# Patient Record
Sex: Female | Born: 1940 | Race: White | Hispanic: No | Smoking: Never smoker
Health system: Southern US, Community
[De-identification: ages and names within clinical notes are randomized; demographics above are authoritative.]

## PROBLEM LIST (undated history)

## (undated) DIAGNOSIS — E7800 Pure hypercholesterolemia, unspecified: Secondary | ICD-10-CM

## (undated) DIAGNOSIS — C801 Malignant (primary) neoplasm, unspecified: Secondary | ICD-10-CM

## (undated) HISTORY — PX: BREAST SURGERY: SHX581

---

## 2018-08-21 ENCOUNTER — Observation Stay (HOSPITAL_BASED_OUTPATIENT_CLINIC_OR_DEPARTMENT_OTHER)
Admission: EM | Admit: 2018-08-21 | Discharge: 2018-08-22 | Disposition: A | Discharge status: Home / Self Care | Payer: Medicare Other | Attending: Internal Medicine | Admitting: Internal Medicine

## 2018-08-21 ENCOUNTER — Encounter (HOSPITAL_BASED_OUTPATIENT_CLINIC_OR_DEPARTMENT_OTHER): Payer: Self-pay | Admitting: *Deleted

## 2018-08-21 ENCOUNTER — Other Ambulatory Visit: Payer: Self-pay

## 2018-08-21 ENCOUNTER — Emergency Department (HOSPITAL_BASED_OUTPATIENT_CLINIC_OR_DEPARTMENT_OTHER): Payer: Medicare Other

## 2018-08-21 DIAGNOSIS — E272 Addisonian crisis: Secondary | ICD-10-CM | POA: Diagnosis not present

## 2018-08-21 DIAGNOSIS — Z853 Personal history of malignant neoplasm of breast: Secondary | ICD-10-CM | POA: Insufficient documentation

## 2018-08-21 DIAGNOSIS — I129 Hypertensive chronic kidney disease with stage 1 through stage 4 chronic kidney disease, or unspecified chronic kidney disease: Secondary | ICD-10-CM | POA: Diagnosis not present

## 2018-08-21 DIAGNOSIS — E7800 Pure hypercholesterolemia, unspecified: Secondary | ICD-10-CM | POA: Insufficient documentation

## 2018-08-21 DIAGNOSIS — Z7982 Long term (current) use of aspirin: Secondary | ICD-10-CM | POA: Insufficient documentation

## 2018-08-21 DIAGNOSIS — Z9889 Other specified postprocedural states: Secondary | ICD-10-CM | POA: Diagnosis not present

## 2018-08-21 DIAGNOSIS — Z79899 Other long term (current) drug therapy: Secondary | ICD-10-CM | POA: Insufficient documentation

## 2018-08-21 DIAGNOSIS — J449 Chronic obstructive pulmonary disease, unspecified: Secondary | ICD-10-CM | POA: Diagnosis present

## 2018-08-21 DIAGNOSIS — N183 Chronic kidney disease, stage 3 (moderate): Secondary | ICD-10-CM | POA: Insufficient documentation

## 2018-08-21 DIAGNOSIS — E785 Hyperlipidemia, unspecified: Secondary | ICD-10-CM | POA: Diagnosis not present

## 2018-08-21 DIAGNOSIS — Z885 Allergy status to narcotic agent status: Secondary | ICD-10-CM | POA: Insufficient documentation

## 2018-08-21 DIAGNOSIS — I9589 Other hypotension: Secondary | ICD-10-CM | POA: Diagnosis not present

## 2018-08-21 DIAGNOSIS — Z859 Personal history of malignant neoplasm, unspecified: Secondary | ICD-10-CM | POA: Insufficient documentation

## 2018-08-21 DIAGNOSIS — R531 Weakness: Secondary | ICD-10-CM | POA: Diagnosis present

## 2018-08-21 DIAGNOSIS — D649 Anemia, unspecified: Secondary | ICD-10-CM | POA: Insufficient documentation

## 2018-08-21 DIAGNOSIS — J441 Chronic obstructive pulmonary disease with (acute) exacerbation: Secondary | ICD-10-CM | POA: Insufficient documentation

## 2018-08-21 HISTORY — DX: Malignant (primary) neoplasm, unspecified: C80.1

## 2018-08-21 HISTORY — DX: Pure hypercholesterolemia, unspecified: E78.00

## 2018-08-21 LAB — COMPREHENSIVE METABOLIC PANEL
ALBUMIN: 3 g/dL — AB (ref 3.5–5.0)
ALT: 17 U/L (ref 0–44)
AST: 15 U/L (ref 15–41)
Alkaline Phosphatase: 48 U/L (ref 38–126)
Anion gap: 10 (ref 5–15)
BUN: 34 mg/dL — AB (ref 8–23)
CHLORIDE: 100 mmol/L (ref 98–111)
CO2: 27 mmol/L (ref 22–32)
CREATININE: 1.52 mg/dL — AB (ref 0.44–1.00)
Calcium: 8.8 mg/dL — ABNORMAL LOW (ref 8.9–10.3)
GFR calc Af Amer: 38 mL/min — ABNORMAL LOW (ref >60)
GFR, EST NON AFRICAN AMERICAN: 32 mL/min — AB (ref >60)
GLUCOSE: 102 mg/dL — AB (ref 70–99)
POTASSIUM: 4.4 mmol/L (ref 3.5–5.1)
Sodium: 137 mmol/L (ref 135–145)
Total Bilirubin: 0.8 mg/dL (ref 0.3–1.2)
Total Protein: 5.7 g/dL — ABNORMAL LOW (ref 6.5–8.1)

## 2018-08-21 LAB — CBC WITH DIFFERENTIAL/PLATELET
Abs Immature Granulocytes: 0.15 10*3/uL — ABNORMAL HIGH (ref 0.00–0.07)
BASOS PCT: 0 %
Basophils Absolute: 0 10*3/uL (ref 0.0–0.1)
Eosinophils Absolute: 0.1 10*3/uL (ref 0.0–0.5)
Eosinophils Relative: 1 %
HCT: 36.2 % (ref 36.0–46.0)
Hemoglobin: 11.2 g/dL — ABNORMAL LOW (ref 12.0–15.0)
IMMATURE GRANULOCYTES: 2 %
LYMPHS ABS: 2.1 10*3/uL (ref 0.7–4.0)
Lymphocytes Relative: 23 %
MCH: 30.3 pg (ref 26.0–34.0)
MCHC: 30.9 g/dL (ref 30.0–36.0)
MCV: 97.8 fL (ref 80.0–100.0)
MONOS PCT: 8 %
Monocytes Absolute: 0.8 10*3/uL (ref 0.1–1.0)
NEUTROS ABS: 6 10*3/uL (ref 1.7–7.7)
NEUTROS PCT: 66 %
PLATELETS: 165 10*3/uL (ref 150–400)
RBC: 3.7 MIL/uL — ABNORMAL LOW (ref 3.87–5.11)
RDW: 14.8 % (ref 11.5–15.5)
WBC: 9 10*3/uL (ref 4.0–10.5)
nRBC: 0 % (ref 0.0–0.2)

## 2018-08-21 LAB — CBG MONITORING, ED: Glucose-Capillary: 84 mg/dL (ref 70–99)

## 2018-08-21 LAB — URINALYSIS, ROUTINE W REFLEX MICROSCOPIC
BILIRUBIN URINE: NEGATIVE
GLUCOSE, UA: NEGATIVE mg/dL
Hgb urine dipstick: NEGATIVE
Ketones, ur: NEGATIVE mg/dL
Leukocytes, UA: NEGATIVE
Nitrite: NEGATIVE
PH: 5.5 (ref 5.0–8.0)
Protein, ur: NEGATIVE mg/dL

## 2018-08-21 LAB — I-STAT CG4 LACTIC ACID, ED: LACTIC ACID, VENOUS: 0.53 mmol/L (ref 0.5–1.9)

## 2018-08-21 MED ORDER — SODIUM CHLORIDE 0.9 % IV BOLUS: 1000.0000 mL | Freq: Once | INTRAVENOUS | Status: DC

## 2018-08-21 MED ORDER — HYDROCORTISONE NA SUCCINATE PF 100 MG IJ SOLR
100.0000 mg | Freq: Once | INTRAMUSCULAR | Status: AC
  Administered 2018-08-21: 100 mg via INTRAVENOUS
  Filled 2018-08-21: qty 2

## 2018-08-21 MED ORDER — LACTATED RINGERS IV BOLUS
1000.0000 mL | Freq: Once | INTRAVENOUS | Status: AC
  Administered 2018-08-21: 1000 mL via INTRAVENOUS

## 2018-08-21 NOTE — ED Triage Notes (Signed)
Sent from her MD's office with BP 70/ MD states she needs IV fluids. Pt states her BP is always low.

## 2018-08-21 NOTE — ED Provider Notes (Signed)
[facility] EMERGENCY DEPARTMENT Provider Note   CSN: [id number] Arrival date & time: 08/21/18  1453     History   Chief Complaint Chief Complaint  Patient presents with  . Weakness    HPI Alane Hanssen is a 77 y.o. female with past medical history of COPD, adrenal insufficiency, peripheral vascular disease, breast cancerin left breast, who presents today for evaluation of hypertension.  She went to her endocrinologist today where they were having a difficult time finding her blood pressure.  She was referred here.  She tells me that she has been on 30 mg of prednisone for the past 30 days however ran out of this on Wednesday.  She was started on this during a hospital admission from 9 22-10 2 at [facility] for adrenal insufficiency.  She reports that since Wednesday when she ran out she has generally not been feeling well.  She reports puffy face, feeling weak, and generally not feeling well.  She reports that on Wednesday she walked into a door frame causing her to fall.  She does not take blood thinners other than aspirin.  She reports that she hit her face on the door frame, and has had bruising around her right eye since.  She reports pain in her right sided chest and abdomen from the fall.  She has not been using her inhalers for her COPD recently.  HPI  Past Medical History:  Diagnosis Date  . Cancer ([place])   . High cholesterol     Patient Active Problem List   Diagnosis Date Noted  . Adrenal crisis ([place]) 08/21/2018    Past Surgical History:  Procedure Laterality Date  . BREAST SURGERY       OB History   None      Home Medications    Prior to Admission medications   Medication Sig Start Date End Date Taking? Authorizing Provider  aspirin 81 MG tablet Take 81 mg by mouth daily.   Yes [provider]  citalopram (CELEXA) 10 MG tablet Take 10 mg by mouth daily.   Yes [provider]  ezetimibe (ZETIA) 10 MG tablet Take 10  mg by mouth daily.   Yes [provider]  SIMVASTATIN PO Take by mouth.   Yes [provider]  Zolpidem Tartrate (AMBIEN PO) Take by mouth.   Yes [provider]    Family History No family history on file.  Social History Social History   Tobacco Use  . Smoking status: Never Smoker  . Smokeless tobacco: Never Used  Substance Use Topics  . Alcohol use: Never    Frequency: Never  . Drug use: Never     Allergies   Codeine   Review of Systems Review of Systems  Constitutional: Positive for activity change, appetite change and fatigue.  HENT: Positive for facial swelling (Generalized puffy face).   Eyes: Negative for visual disturbance.  Respiratory: Negative for chest tightness and shortness of breath.   Cardiovascular: Positive for chest pain (Right sided since fall).  Gastrointestinal: Positive for abdominal pain (Right sided since fall). Negative for diarrhea, nausea and vomiting.  Musculoskeletal: Negative for neck pain.  Neurological: Positive for light-headedness.  All other systems reviewed and are negative.    Physical Exam Updated Vital Signs BP 104/63   Pulse 87   Temp 98.1 F (36.7 C) (Oral)   Resp 18   Ht 5\' 6"  (1.676 m)   Wt 92.6 kg   SpO2 93%  BMI 32.94 kg/m   Physical Exam  Constitutional: She is oriented to person, place, and time. She appears well-developed and well-nourished. No distress.  HENT:  Head: Normocephalic.  Mouth/Throat: Oropharynx is clear and moist.  Face is diffusely puffy.  Ecchymosis under right eye.   Eyes: Conjunctivae are normal.  Neck: Normal range of motion. Neck supple.  No midline TTP.   Cardiovascular: Normal rate, regular rhythm and normal heart sounds.  No murmur heard. Pulses:      Radial pulses are 1+ on the right side, and 1+ on the left side.  Initial evaluation unable to palpate radial pulse, after fluids able to palpate both radial pulses.   Pulmonary/Chest: Effort normal. No  respiratory distress.  Right upper field rhonchi  Abdominal: Soft. There is no tenderness.  Musculoskeletal: She exhibits no edema.  Diffuse TTP over bilateral upper and lower extremities with out localized deformity, crepitus or one area that is more painful.   Neurological: She is alert and oriented to person, place, and time.  Skin: Skin is warm and dry. She is not diaphoretic.  Psychiatric: She has a normal mood and affect. Her behavior is normal.  Nursing note and vitals reviewed.    ED Treatments / Results  Labs (all labs ordered are listed, but only abnormal results are displayed) Labs Reviewed  CBC WITH DIFFERENTIAL/PLATELET - Abnormal; Notable for the following components:      Result Value   RBC 3.70 (*)    Hemoglobin 11.2 (*)    Abs Immature Granulocytes 0.15 (*)    All other components within normal limits  COMPREHENSIVE METABOLIC PANEL - Abnormal; Notable for the following components:   Glucose, Bld 102 (*)    BUN 34 (*)    Creatinine, Ser 1.52 (*)    Calcium 8.8 (*)    Total Protein 5.7 (*)    Albumin 3.0 (*)    GFR calc non Af Amer 32 (*)    GFR calc Af Amer 38 (*)    All other components within normal limits  URINALYSIS, ROUTINE W REFLEX MICROSCOPIC - Abnormal; Notable for the following components:   Specific Gravity, Urine >1.030 (*)    All other components within normal limits  CBG MONITORING, ED  I-STAT CG4 LACTIC ACID, ED  I-STAT CG4 LACTIC ACID, ED    EKG None  Radiology Dg Chest Portable 1 View  Result Date: 08/21/2018 CLINICAL DATA:  Low blood pressure. EXAM: PORTABLE CHEST 1 VIEW COMPARISON:  None. FINDINGS: The cardiomediastinal silhouette is within normal limits. There is eventration of the right hemidiaphragm. No airspace consolidation, edema, pleural effusion, pneumothorax is identified. No acute osseous abnormality is seen. Left axillary surgical clips are noted. IMPRESSION: No active disease. Electronically Signed   By: Logan Bores M.D.    On: 08/21/2018 15:59    Procedures Procedures (including critical care time) CRITICAL CARE Performed by: Wyn Quaker Total critical care time: 75 minutes Critical care time was exclusive of separately billable procedures and treating other patients. Critical care was necessary to treat or prevent imminent or life-threatening deterioration. Critical care was time spent personally by me on the following activities: development of treatment plan with patient and/or surrogate as well as nursing, discussions with consultants, evaluation of patient's response to treatment, examination of patient, obtaining history from patient or surrogate, ordering and performing treatments and interventions, ordering and review of laboratory studies, ordering and review of radiographic studies, pulse oximetry and re-evaluation of patient's condition.  Hypotension, addisonian crisis requiring admission,  extensive time spent on finding appropriate placement for the patient that she was willing to go to.    Medications Ordered in ED Medications  hydrocortisone sodium succinate (SOLU-CORTEF) 100 MG injection 100 mg (100 mg Intravenous Given 08/21/18 1555)  lactated ringers bolus 1,000 mL (0 mLs Intravenous Stopped 08/21/18 1825)     Initial Impression / Assessment and Plan / ED Course  I have reviewed the triage vital signs and the nursing notes.  Pertinent labs & imaging results that were available during my care of the patient were reviewed by me and considered in my medical decision making (see chart for details).  Clinical Course as of Aug 22 2343  Mon Aug 21, 2018  1530 Patient BP 62 systolic.  Ordered Iv, Fluids, steroids.    [EH]  4010 [place] [facility] does not have beds   [EH]  1755 Patient wishes to go to [facility] medical center.    [EH]  [place] Dr. Alease Medina from [facility] said [facility] is not appropriate given that they do not have endocrinology and  patient needs to be seen by endocrinology.  [facility] physician to physician line is coordinating speaking with hospitalist at [facility].   [EH]  1846 Dr. Bynum Bellows at [facility] says that they do not have in-house endocrinology.  Recommends seeing if [facility] does and if they will take the patient, if not she will admit patient.    [EH]  [facility] states that it would be a 24 to 48-hour wait before patient can be accepted onto a floor bed at the hospital.  Will call back [place].   [EH]  1904 Spoke with Elmyra Ricks at [facility] Dr. connect physician line, phone [phone], who will facilitate getting back in touch with hospitalist.   [EH]  1912 Spoke with Elmyra Ricks at [facility] Dr. Ronnie [facility] physician line.  She says Dr. Gaynelle Adu will accept patient pending bed availability.    [EH]  1944 Spoke with Elmyra Ricks at [facility] Dr. connect physician line, she has placed a bed request, waiting to hear back from bed placement.  Will notify me either way.   [EH]  2036 Secretary spoke with Elmyra Ricks at [facility].  [facility] does not have beds available.  Discussed this with patient, will attempt to place patient at [facility]   [EH]  2049 Spoke with Dr. Hal Hope from [facility] hospitalist who accepts patient in transfer at [facility].   [EH]  2216 Updated patient on disposition and plan.   [EH]    Clinical Course User Index [EH] Lorin Glass, PA-C   Patient presents today from her endocrinologist office for evaluation of hypotension.  She had stopped her prednisone last Wednesday and since then has not been feeling well.  She had been on prednisone for 30 days 30 mg.  She was hypotensive here, pressure of 62 systolic.  She was treated with 1 L of IV fluids, and given 100 mg of Solu-Cortef, to treat presumed adrenal crisis.  This stabilized her condition, she still had soft pressures however they were systolics over 347.  Her creatinine is elevated at 1.52 with a GFR of 23.  Lactic acid was  not elevated.  Portable chest x-ray was obtained without evidence of acute abnormality.  EKG was obtained and reviewed.    Extensive time was spent coordinating patient admission and transfer of care due to patient preference, and multiple facilities without available floor beds.  Please see ED clinical course.  After was  unable to have patient admitted to the floor at 2 [facility] facilities, [facility], and [facility] patient agreed for transfer to [facility].  I spoke with Dr. Hal Hope who accepts the patient in transfer from [facility].  Patient was monitored while in the emergency room.    [facility] was [facility].   Final Clinical Impressions(s) / ED Diagnoses   Final diagnoses:  Acute adrenal crisis [facility])  Other specified hypotension    ED Discharge Orders    None       Lorin Glass, PA-C 08/21/18 2351    Lennice Sites, DO 08/22/18 0123    Lennice Sites, DO 08/22/18 0124

## 2018-08-21 NOTE — ED Provider Notes (Addendum)
Medical screening examination/treatment/procedure(s) were conducted as a shared visit with non-physician practitioner(s) and myself.  I personally evaluated the patient during the encounter. Briefly, the patient is a 77 y.o. female with history of high cholesterol, recent chronic steroid use for the last 2 months for adrenal insufficiency who presents to the ED with low blood pressure.  Patient with blood pressure 62/32 upon arrival however patient mentating well.  No chest pain, no infectious symptoms.  No fever, no tachycardia.  Patient was sent over from endocrinologist for likely steroids.  Patient was on a 34-month treatment of steroids for an adrenal issue.  She was not put on a steroid taper.  She finished her 30 mg of prednisone daily about 3 or 4 days ago and had follow-up with endocrinologist today and was found to be hypotensive and brought here.  Endocrinologist note states that patient likely needs stress dose steroids and recommends the patient start steroids again at 20 mg a day and will likely create taper for the patient after a month.  Patient without any infectious symptoms, no dysuria, no cough, no sputum production.  No fever.  Neurologically intact.  Overall is well-appearing.  Patient was given Solu-Cortef IV as well as IV fluid bolus.  Lab work was obtained that showed no significant anemia, electrolyte abnormality.  Creatinine mildly elevated.  Patient with no significant leukocytosis.  No signs of pneumonia on chest x-ray.  No signs of urinary tract infection.  Patient with improvement of blood pressure following Solu-Cortef and fluids however will admit the patient for further care and observation.  Suspect patient will improve with steroids and will follow up with endocrinology for appropriate steroid taper.  This chart was dictated using voice recognition software.  Despite best efforts to proofread,  errors can occur which can change the documentation meaning.    EKG  Interpretation None           Lennice Sites, DO 08/21/18 [place], [place], DO 08/21/18 1751

## 2018-08-22 ENCOUNTER — Encounter (HOSPITAL_COMMUNITY): Payer: Self-pay | Admitting: Internal Medicine

## 2018-08-22 DIAGNOSIS — E785 Hyperlipidemia, unspecified: Secondary | ICD-10-CM | POA: Diagnosis present

## 2018-08-22 DIAGNOSIS — J449 Chronic obstructive pulmonary disease, unspecified: Secondary | ICD-10-CM | POA: Diagnosis present

## 2018-08-22 DIAGNOSIS — E272 Addisonian crisis: Secondary | ICD-10-CM

## 2018-08-22 DIAGNOSIS — Z853 Personal history of malignant neoplasm of breast: Secondary | ICD-10-CM

## 2018-08-22 DIAGNOSIS — I9589 Other hypotension: Secondary | ICD-10-CM | POA: Diagnosis not present

## 2018-08-22 LAB — BASIC METABOLIC PANEL
ANION GAP: 8 (ref 5–15)
BUN: 28 mg/dL — ABNORMAL HIGH (ref 8–23)
CALCIUM: 8.4 mg/dL — AB (ref 8.9–10.3)
CO2: 25 mmol/L (ref 22–32)
Chloride: 105 mmol/L (ref 98–111)
Creatinine, Ser: 1.65 mg/dL — ABNORMAL HIGH (ref 0.44–1.00)
GFR calc Af Amer: 34 mL/min — ABNORMAL LOW (ref >60)
GFR calc non Af Amer: 29 mL/min — ABNORMAL LOW (ref >60)
Glucose, Bld: 197 mg/dL — ABNORMAL HIGH (ref 70–99)
Potassium: 4.1 mmol/L (ref 3.5–5.1)
Sodium: 138 mmol/L (ref 135–145)

## 2018-08-22 LAB — CBC
HCT: 33.4 % — ABNORMAL LOW (ref 36.0–46.0)
HEMOGLOBIN: 10.3 g/dL — AB (ref 12.0–15.0)
MCH: 29.6 pg (ref 26.0–34.0)
MCHC: 30.8 g/dL (ref 30.0–36.0)
MCV: 96 fL (ref 80.0–100.0)
PLATELETS: 170 10*3/uL (ref 150–400)
RBC: 3.48 MIL/uL — AB (ref 3.87–5.11)
RDW: 14.6 % (ref 11.5–15.5)
WBC: 7.8 10*3/uL (ref 4.0–10.5)
nRBC: 0 % (ref 0.0–0.2)

## 2018-08-22 MED ORDER — UMECLIDINIUM BROMIDE 62.5 MCG/INH IN AEPB
1.0000 | INHALATION_SPRAY | Freq: Every day | RESPIRATORY_TRACT | Status: DC
  Filled 2018-08-22: qty 7

## 2018-08-22 MED ORDER — ASPIRIN 81 MG PO CHEW
81.0000 mg | CHEWABLE_TABLET | Freq: Every day | ORAL | Status: DC
  Administered 2018-08-22: 81 mg via ORAL
  Filled 2018-08-22: qty 1

## 2018-08-22 MED ORDER — ONDANSETRON HCL 4 MG PO TABS: 4.0000 mg | ORAL_TABLET | Freq: Four times a day (QID) | ORAL | Status: DC | PRN

## 2018-08-22 MED ORDER — CITALOPRAM HYDROBROMIDE 20 MG PO TABS
40.0000 mg | ORAL_TABLET | Freq: Every day | ORAL | Status: DC
  Administered 2018-08-22: 40 mg via ORAL
  Filled 2018-08-22: qty 2

## 2018-08-22 MED ORDER — FLUTICASONE FUROATE-VILANTEROL 100-25 MCG/INH IN AEPB
1.0000 | INHALATION_SPRAY | Freq: Every day | RESPIRATORY_TRACT | Status: DC
  Administered 2018-08-22: 1 via RESPIRATORY_TRACT
  Filled 2018-08-22: qty 28

## 2018-08-22 MED ORDER — UMECLIDINIUM BROMIDE 62.5 MCG/INH IN AEPB
1.0000 | INHALATION_SPRAY | Freq: Every day | RESPIRATORY_TRACT | Status: DC
  Administered 2018-08-22: 1 via RESPIRATORY_TRACT
  Filled 2018-08-22: qty 7

## 2018-08-22 MED ORDER — SIMVASTATIN 40 MG PO TABS
40.0000 mg | ORAL_TABLET | Freq: Every day | ORAL | Status: DC
  Administered 2018-08-22: 40 mg via ORAL
  Filled 2018-08-22: qty 1

## 2018-08-22 MED ORDER — SODIUM CHLORIDE 0.9 % IV BOLUS: 500.0000 mL | Freq: Once | INTRAVENOUS | Status: DC

## 2018-08-22 MED ORDER — ACETAMINOPHEN 325 MG PO TABS: 650.0000 mg | ORAL_TABLET | Freq: Four times a day (QID) | ORAL | Status: DC | PRN

## 2018-08-22 MED ORDER — ONDANSETRON HCL 4 MG/2ML IJ SOLN: 4.0000 mg | Freq: Four times a day (QID) | INTRAMUSCULAR | Status: DC | PRN

## 2018-08-22 MED ORDER — FLUTICASONE FUROATE-VILANTEROL 100-25 MCG/INH IN AEPB
1.0000 | INHALATION_SPRAY | Freq: Every day | RESPIRATORY_TRACT | Status: DC
  Filled 2018-08-22: qty 28

## 2018-08-22 MED ORDER — ENOXAPARIN SODIUM 40 MG/0.4ML ~~LOC~~ SOLN: 40.0000 mg | SUBCUTANEOUS | Status: DC

## 2018-08-22 MED ORDER — EZETIMIBE 10 MG PO TABS
10.0000 mg | ORAL_TABLET | Freq: Every day | ORAL | Status: DC
  Administered 2018-08-22: 10 mg via ORAL
  Filled 2018-08-22: qty 1

## 2018-08-22 MED ORDER — SODIUM CHLORIDE 0.9 % IV SOLN
INTRAVENOUS | Status: DC
  Administered 2018-08-22: 03:00:00 via INTRAVENOUS

## 2018-08-22 MED ORDER — ACETAMINOPHEN 650 MG RE SUPP: 650.0000 mg | Freq: Four times a day (QID) | RECTAL | Status: DC | PRN

## 2018-08-22 MED ORDER — PREDNISONE 10 MG PO TABS: ORAL_TABLET | ORAL | 1 refills | Status: DC

## 2018-08-22 MED ORDER — HYDROCORTISONE NA SUCCINATE PF 100 MG IJ SOLR
50.0000 mg | Freq: Three times a day (TID) | INTRAMUSCULAR | Status: DC
  Administered 2018-08-22: 50 mg via INTRAVENOUS
  Filled 2018-08-22: qty 2

## 2018-08-22 NOTE — H&P (Signed)
History and Physical    Alekhya Gravlin WEX:[id number] DOB: Dec 10, 1940 DOA: 08/21/2018  PCP: Magdalene Molly, Inda Merlin, NP  Patient coming from: Home.  Chief Complaint: Low blood pressure.  HPI: Kayla Vasquez is a 77 y.o. female with history of COPD, hyperlipidemia, breast cancer in remission who was recently diagnosed with adrenal insufficiency and was placed on hydrocortisone when patient was admitted last month for COPD exacerbation respiratory failure and pneumonia.  Patient was referred to the endocrinologist by patient's primary care physician.  Patient has ran out of her steroids last Wednesday, August 16, 2018.  Since then patient has been feeling weak.  Has had a fall last week while walking in her home.  Did not lose consciousness.  Denies any chest pain or shortness of breath.  Patient had an appointment with her endocrinologist yesterday and was found to have very low blood pressure and was referred to the ER.  ED Course: In the ER patient's blood pressure was in the 93Y systolic which improved with fluids and hydrocortisone IV.  Patient was afebrile chest x-ray UA unremarkable and patient admitted for further observation for adrenal crisis.  Review of Systems: As per HPI, rest all negative.   Past Medical History:  Diagnosis Date  . Cancer ([place])   . High cholesterol     Past Surgical History:  Procedure Laterality Date  . BREAST SURGERY       reports that she has never smoked. She has never used smokeless tobacco. She reports that she does not drink alcohol or use drugs.  Allergies  Allergen Reactions  . Codeine Nausea And Vomiting    Family History  Problem Relation Age of Onset  . Diabetes Mellitus II Mother   . Hypertension Mother     Prior to Admission medications   Medication Sig Start Date End Date Taking? Authorizing Provider  aspirin 81 MG tablet Take 81 mg by mouth daily.   Yes [provider]  citalopram (CELEXA) 10 MG tablet Take 10 mg by  mouth daily.   Yes [provider]  ezetimibe (ZETIA) 10 MG tablet Take 10 mg by mouth daily.   Yes [provider]  SIMVASTATIN PO Take by mouth.   Yes [provider]  Zolpidem Tartrate (AMBIEN PO) Take by mouth.   Yes [provider]    Physical Exam: Vitals:   08/21/18 2200 08/21/18 2230 08/21/18 2300 08/21/18 2348  BP: 109/62 104/63  126/76  Pulse: 90 87  93  Resp: 17 18  20   Temp:    97.8 F (36.6 C)  TempSrc:    Oral  SpO2: 95% 93%  98%  Weight:   92.6 kg   Height:   5\' 6"  (1.676 m)       Constitutional: Moderately built and nourished. Vitals:   08/21/18 2200 08/21/18 2230 08/21/18 2300 08/21/18 2348  BP: 109/62 104/63  126/76  Pulse: 90 87  93  Resp: 17 18  20   Temp:    97.8 F (36.6 C)  TempSrc:    Oral  SpO2: 95% 93%  98%  Weight:   92.6 kg   Height:   5\' 6"  (1.676 m)    Eyes: Anicteric no pallor. ENMT: No discharge from the ears eyes nose or mouth. Neck: No mass or.  No neck rigidity.  No JVD appreciated. Respiratory: No rhonchi or crepitations. Cardiovascular: S1-S2 heard no murmurs appreciated. Abdomen: Soft nontender bowel sounds present. Musculoskeletal: No edema.  No joint effusion. Skin: No rash.  Neurologic: Alert awake oriented to time place and person.  Moves all extremities. Psychiatric: Appears normal per normal affect.   Labs on Admission: I have personally reviewed following labs and imaging studies  CBC: Recent Labs  Lab 08/21/18 1549  WBC 9.0  NEUTROABS 6.0  HGB 11.2*  HCT 36.2  MCV 97.8  PLT 601   Basic Metabolic Panel: Recent Labs  Lab 08/21/18 1549  NA 137  K 4.4  CL 100  CO2 27  GLUCOSE 102*  BUN 34*  CREATININE 1.52*  CALCIUM 8.8*   GFR: Estimated Creatinine Clearance: 36.7 mL/min (A) (by C-G formula based on SCr of 1.52 mg/dL (H)). Liver Function Tests: Recent Labs  Lab 08/21/18 1549  AST 15  ALT 17  ALKPHOS 48  BILITOT 0.8  PROT 5.7*  ALBUMIN 3.0*   No results for  input(s): LIPASE, AMYLASE in the last 168 hours. No results for input(s): AMMONIA in the last 168 hours. Coagulation Profile: No results for input(s): INR, PROTIME in the last 168 hours. Cardiac Enzymes: No results for input(s): CKTOTAL, CKMB, CKMBINDEX, TROPONINI in the last 168 hours. BNP (last 3 results) No results for input(s): PROBNP in the last 8760 hours. HbA1C: No results for input(s): HGBA1C in the last 72 hours. CBG: Recent Labs  Lab 08/21/18 1549  GLUCAP 84   Lipid Profile: No results for input(s): CHOL, HDL, LDLCALC, TRIG, CHOLHDL, LDLDIRECT in the last 72 hours. Thyroid Function Tests: No results for input(s): TSH, T4TOTAL, FREET4, T3FREE, THYROIDAB in the last 72 hours. Anemia Panel: No results for input(s): VITAMINB12, FOLATE, FERRITIN, TIBC, IRON, RETICCTPCT in the last 72 hours. Urine analysis:    Component Value Date/Time   COLORURINE YELLOW 08/21/2018 1536   APPEARANCEUR CLEAR 08/21/2018 1536   LABSPEC >1.030 (H) 08/21/2018 1536   PHURINE 5.5 08/21/2018 [place] 08/21/2018 1536   HGBUR NEGATIVE 08/21/2018 1536   BILIRUBINUR NEGATIVE 08/21/2018 1536   KETONESUR NEGATIVE 08/21/2018 1536   PROTEINUR NEGATIVE 08/21/2018 1536   NITRITE NEGATIVE 08/21/2018 1536   LEUKOCYTESUR NEGATIVE 08/21/2018 1536   Sepsis Labs: @LABRCNTIP (procalcitonin:4,lacticidven:4) )No results found for this or any previous visit (from the past 240 hour(s)).   Radiological Exams on Admission: Dg Chest Portable 1 View  Result Date: 08/21/2018 CLINICAL DATA:  Low blood pressure. EXAM: PORTABLE CHEST 1 VIEW COMPARISON:  None. FINDINGS: The cardiomediastinal silhouette is within normal limits. There is eventration of the right hemidiaphragm. No airspace consolidation, edema, pleural effusion, pneumothorax is identified. No acute osseous abnormality is seen. Left axillary surgical clips are noted. IMPRESSION: No active disease. Electronically Signed   By: Logan Bores M.D.    On: 08/21/2018 15:59    EKG: Independently reviewed.  Normal sinus rhythm with prolonged PR interval.  Assessment/Plan Principal Problem:   Adrenal crisis (HCC) Active Problems:   COPD with chronic bronchitis (HCC)   History of breast cancer   Hyperlipidemia    1. Adrenal crisis -patient missed her dose for last 6 days.  Patient has been placed on IV hydrocortisone and IV normal saline infusion following which blood pressure improved her symptoms have been improving now.  Continue to observe in telemetry. 2. COPD presently not actively wheezing continue home inhalers. 3. History of breast cancer in remission on Arimidex. 4. Chronic anemia follow CBC. 5. Chronic kidney disease stage III creatinine appears to be at baseline. 6. Hyperlipidemia on statins and Zetia.   DVT prophylaxis: Lovenox. Code Status: Full code. Family Communication: Discussed with patient. Disposition Plan:  Home. Consults called: None. Admission status: Observation.   Rise Patience MD [facility] Pager [phone].  If 7PM-7AM, please contact night-coverage [email or web address] Password [email or web address]  08/22/2018, 2:35 AM

## 2018-08-22 NOTE — Progress Notes (Signed)
Paged Dyanne Carrel NP that pt had iv already taken out by charge nurse and AVS reviewed so unable to give fluid bolus, she advised she did not see 77 bp with orthostatics unitl now. Pt ok to discharge, pt was instructed to stay hydrated, change positions slowly and not to stop any medications unless instructed by md, verbalized understanding

## 2018-08-22 NOTE — Progress Notes (Addendum)
Pt discharge instructions reviewed with pt. Pt verbalizes understanding and states she has no questions. Pt belongings with pt. Pt is not in distress. Pt's ride will arrive at 1300.

## 2018-08-22 NOTE — Discharge Summary (Signed)
Physician Discharge Summary  Kayla Vasquez:[id number] DOB: Mar 23, 1941 DOA: 08/21/2018  PCP: Kayla Vasquez, Kayla Merlin, NP  Admit date: 08/21/2018 Discharge date: 08/22/2018  Time spent: 40 minutes  Recommendations for Outpatient Follow-up:  1. Follow up with PCP in one week for evaluation of adrenal crisis 2. Take medication as prescribed 3. Do not stop prednisone abruptly, get refill if running low before next appointment   Discharge Diagnoses:  Principal Problem:   Adrenal crisis [name]) Active Problems:   COPD with chronic bronchitis ([place])   History of breast cancer   Hyperlipidemia   Discharge Condition: stable  Diet recommendation: heart healthy  Filed Weights   08/21/18 1501 08/21/18 2300 08/22/18 0403  Weight: 92.5 kg 92.6 kg 92.9 kg    History of present illness:  Kayla Vasquez is a 77 y.o. female with history of COPD, hyperlipidemia, breast cancer in remission who was recently diagnosed with adrenal insufficiency and was placed on hydrocortisone when patient was admitted last month for COPD exacerbation respiratory failure and pneumonia.  Patient was referred to the endocrinologist by patient's primary care physician.  Patient ran out of her steroids last Wednesday, August 16, 2018.  Since then patient has been feeling weak. Denied any chest pain or shortness of breath.  Patient had an appointment with her endocrinologist 11/18 and was found to have very low blood pressure and was referred to the ER.  Hospital Course:  1. Adrenal crisis -patient missed her dose for last 6 days. She had follow up appointment 11/18 but initial prescription ran out before that time and she states she did not know to get refill. Patient provided with IV hydrocortisone and IV normal saline infusion following which blood pressure improved. At time of discharge she is hemodynamically stable and symptom free. BP does drops from sitting to standing initially but recovers. Will give 500cc bolus  NS at discharge. Her original prednisone dose 20mg  in am and 10 mg at nite. Will taper with 20mg  bid x 3days then resume former dosage 2. COPD. stable 3. History of breast cancer in remission on Arimidex. 4. Chronic anemia. At baseline. No s/sx bleeding 5. Chronic kidney disease stage III creatinine: at baseline. 6. Hyperlipidemia on statins and Zetia.   Procedures:    Consultations:  none  Discharge Exam: Vitals:   08/22/18 0403 08/22/18 0833  BP: 122/72   Pulse: 89 93  Resp: 18 20  Temp: 98.2 F (36.8 C)   SpO2: 95% 97%    General: well nourished in no acute distress Cardiovascular: rrr no mgr no LE edema Respiratory: normal effort BS clear bilaterally  Discharge Instructions   Discharge Instructions    Call MD for:  difficulty breathing, headache or visual disturbances   Complete by:  As directed    Call MD for:  persistant dizziness or light-headedness   Complete by:  As directed    Call MD for:  temperature >100.4   Complete by:  As directed    Diet - low sodium heart healthy   Complete by:  As directed    Discharge instructions   Complete by:  As directed    Follow up with PCP 1 week for evaluation of adrenal crisis Take medication as directed Do not stop prednisone abruptly.   Increase activity slowly   Complete by:  As directed      Allergies as of 08/22/2018      Reactions   Codeine Nausea And Vomiting      Medication List  TAKE these medications   AMBIEN 5 MG tablet Generic drug:  zolpidem Take 5 mg by mouth at bedtime.   anastrozole 1 MG tablet Commonly known as:  ARIMIDEX Take 1 mg by mouth every evening.   aspirin 325 MG tablet Take 325 mg by mouth daily.   CALCIUM PO Take 1 tablet by mouth every evening.   citalopram 10 MG tablet Commonly known as:  CELEXA Take 10 mg by mouth daily.   ezetimibe 10 MG tablet Commonly known as:  ZETIA Take 10 mg by mouth daily.   fluticasone furoate-vilanterol 100-25 MCG/INH  Aepb Commonly known as:  BREO ELLIPTA Inhale 1 puff into the lungs daily.   HAIR SKIN AND NAILS FORMULA PO Take 1 tablet by mouth every evening.   ibuprofen 200 MG tablet Commonly known as:  ADVIL,MOTRIN Take 400 mg by mouth every 6 (six) hours as needed for mild pain.   INCRUSE ELLIPTA 62.5 MCG/INH Aepb Generic drug:  umeclidinium bromide Inhale 1 puff into the lungs daily.   MULTI-VITAMINS Tabs Take 1 tablet by mouth every evening.   predniSONE 10 MG tablet Commonly known as:  DELTASONE Take 2 tabs twice daily for 3 days then take 2 tabs in am and 1 tab in evening for 30 days What changed:    how much to take  how to take this  when to take this  additional instructions   PROVENTIL HFA 108 (90 Base) MCG/ACT inhaler Generic drug:  albuterol Inhale 2 puffs into the lungs every 6 (six) hours as needed for wheezing.   simvastatin 40 MG tablet Commonly known as:  ZOCOR Take 40 mg by mouth every evening.      Allergies  Allergen Reactions  . Codeine Nausea And Vomiting      The results of significant diagnostics from this hospitalization (including imaging, microbiology, ancillary and laboratory) are listed below for reference.    Significant Diagnostic Studies: Dg Chest Portable 1 View  Result Date: 08/21/2018 CLINICAL DATA:  Low blood pressure. EXAM: PORTABLE CHEST 1 VIEW COMPARISON:  None. FINDINGS: The cardiomediastinal silhouette is within normal limits. There is eventration of the right hemidiaphragm. No airspace consolidation, edema, pleural effusion, pneumothorax is identified. No acute osseous abnormality is seen. Left axillary surgical clips are noted. IMPRESSION: No active disease. Electronically Signed   By: Logan Bores M.D.   On: 08/21/2018 15:59    Microbiology: No results found for this or any previous visit (from the past 240 hour(s)).   Labs: Basic Metabolic Panel: Recent Labs  Lab 08/21/18 1549 08/22/18 0415  NA 137 138  K 4.4 4.1  CL  100 105  CO2 27 25  GLUCOSE 102* 197*  BUN 34* 28*  CREATININE 1.52* 1.65*  CALCIUM 8.8* 8.4*   Liver Function Tests: Recent Labs  Lab 08/21/18 1549  AST 15  ALT 17  ALKPHOS 48  BILITOT 0.8  PROT 5.7*  ALBUMIN 3.0*   No results for input(s): LIPASE, AMYLASE in the last 168 hours. No results for input(s): AMMONIA in the last 168 hours. CBC: Recent Labs  Lab 08/21/18 1549 08/22/18 0415  WBC 9.0 7.8  NEUTROABS 6.0  --   HGB 11.2* 10.3*  HCT 36.2 33.4*  MCV 97.8 96.0  PLT 165 170   Cardiac Enzymes: No results for input(s): CKTOTAL, CKMB, CKMBINDEX, TROPONINI in the last 168 hours. BNP: BNP (last 3 results) No results for input(s): BNP in the last 8760 hours.  ProBNP (last 3 results) No results for  input(s): PROBNP in the last 8760 hours.  CBG: Recent Labs  Lab 08/21/18 1549  GLUCAP 84       Signed:  Radene Gunning NP [facility] 08/22/2018, 11:50 AM

## 2018-08-22 NOTE — Progress Notes (Signed)
NURSING PROGRESS NOTE  Kayla Vasquez [id number] Admission Data: 08/22/2018 6:12 AM Attending Provider: Rise Patience, MD NTZ:[id number] Jorge Ny, NP Code Status: Full  Kayla Vasquez is a 77 y.o. female patient admitted from ED:  -No acute distress noted.  -No complaints of shortness of breath.  -No complaints of chest pain.   Cardiac Monitoring: Box # 20 in place. Cardiac monitor yields:normal sinus rhythm.  Blood pressure 122/72, pulse 89, temperature 98.2 F (36.8 C), temperature source Oral, resp. rate 18, height 5\' 6"  (1.676 m), weight 92.9 kg, SpO2 95 %.   IV Fluids:  IV in place, occlusive dsg intact without redness, IV cath antecubital right, condition patent and no redness none.   Allergies:  Codeine  Past Medical History:   has a past medical history of Cancer ([place]) and High cholesterol.  Past Surgical History:   has a past surgical history that includes Breast surgery.  Social History:   reports that she has never smoked. She has never used smokeless tobacco. She reports that she does not drink alcohol or use drugs.  Skin: Scattered bruises  Patient/Family orientated to room. Information packet given to patient/family. Admission inpatient armband information verified with patient/family to include name and date of birth and placed on patient arm. Side rails up x 2, fall assessment and education completed with patient/family. Patient/family able to verbalize understanding of risk associated with falls and verbalized understanding to call for assistance before getting out of bed. Call light within reach. Patient/family able to voice and demonstrate understanding of unit orientation instructions.    Will continue to evaluate and treat per MD orders.

## 2020-06-23 IMAGING — DX DG CHEST 1V PORT
2 series · 2 of 2 positions shown · non-contrast
Comparison: None.

CLINICAL DATA: Low blood pressure.

EXAM:
PORTABLE CHEST 1 VIEW

[chest ap (1 of 2)]
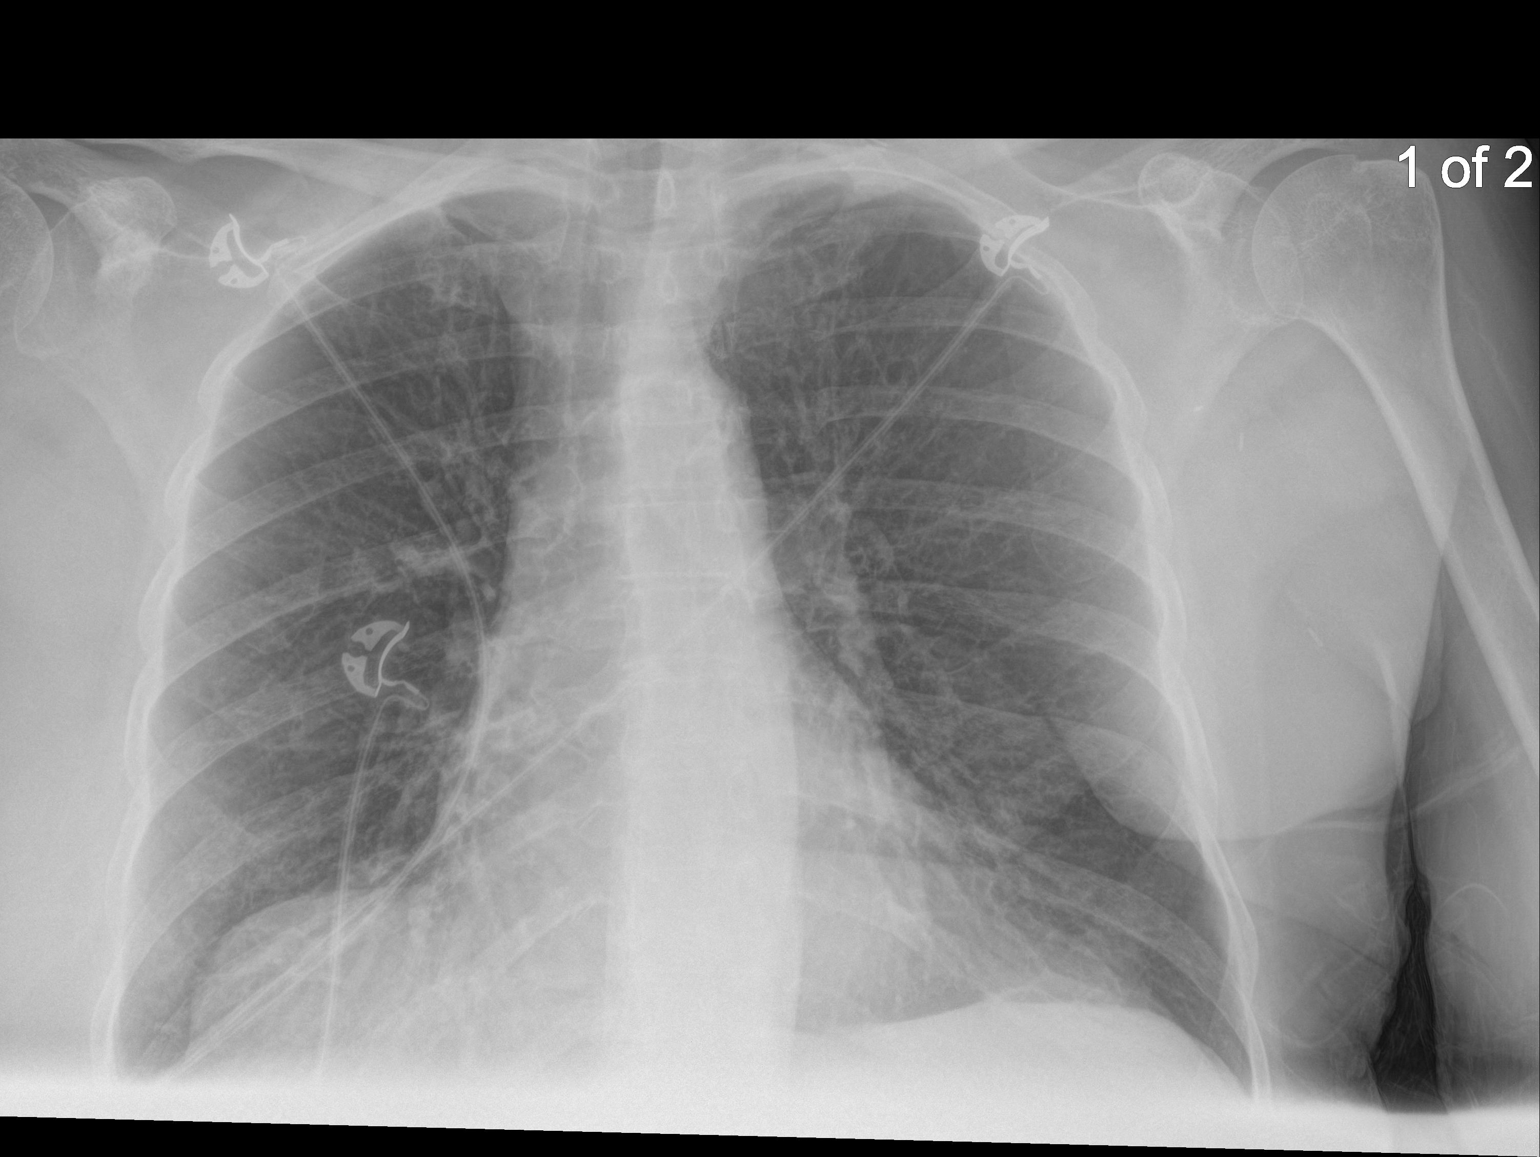

[chest ap (2 of 2)]
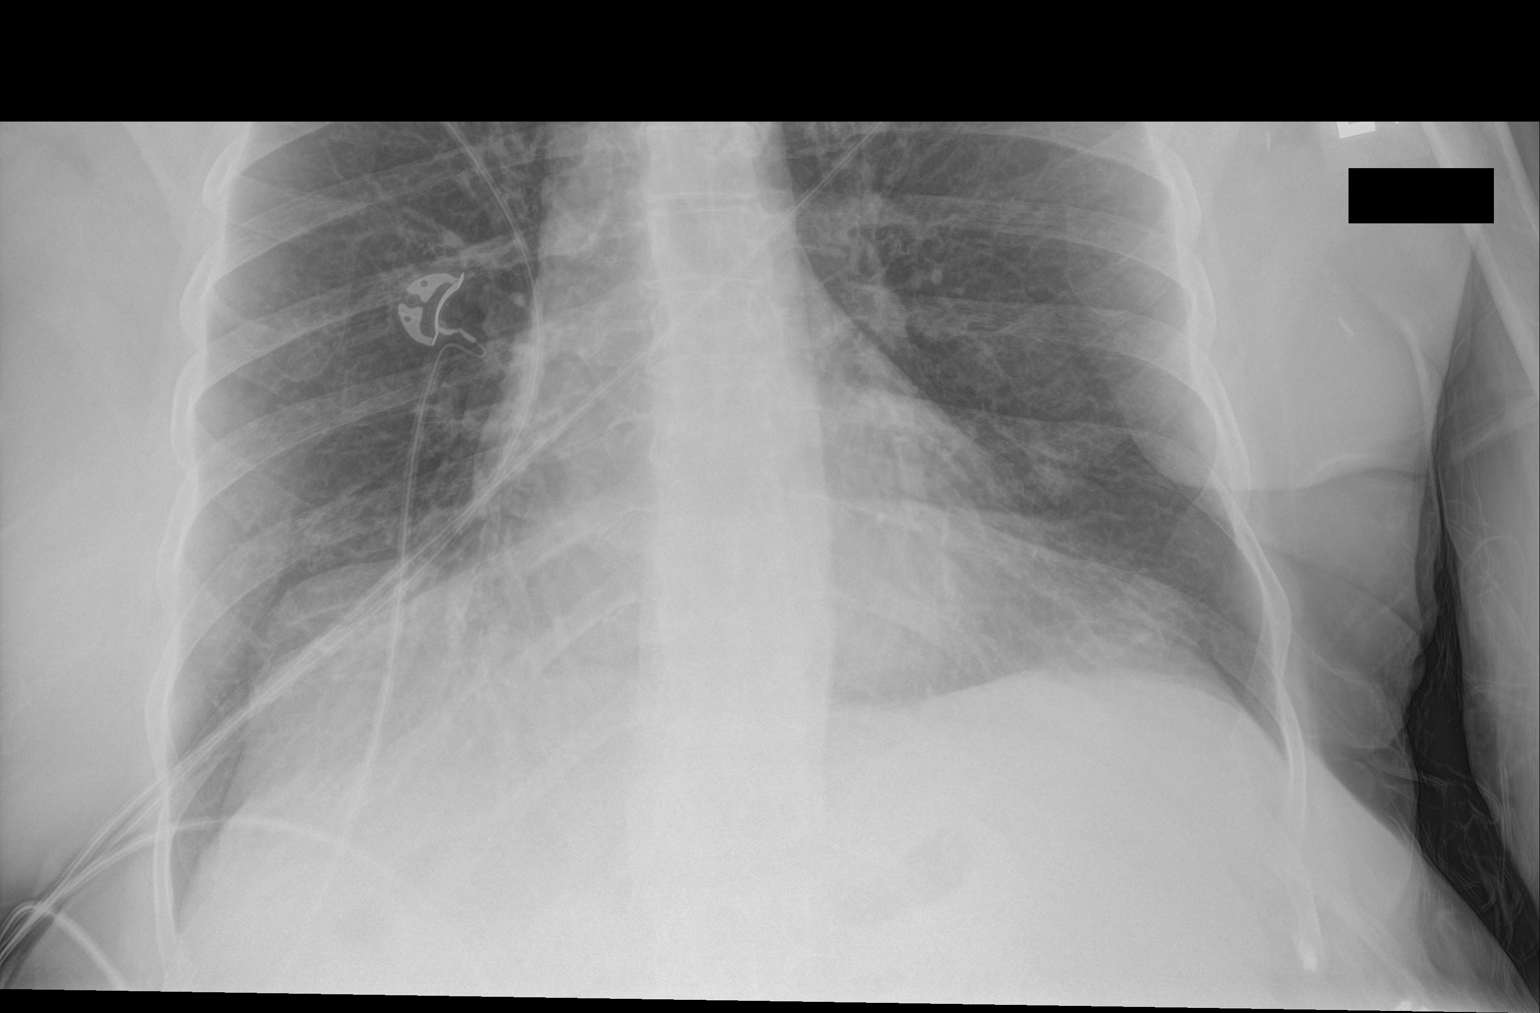

[2 of 2 positions shown; findings below may reference images not displayed]

FINDINGS: The cardiomediastinal silhouette is within normal limits. There is
eventration of the right hemidiaphragm. No airspace consolidation,
edema, pleural effusion, pneumothorax is identified. No acute
osseous abnormality is seen. Left axillary surgical clips are noted.
IMPRESSION: No active disease.

## 2023-02-15 ENCOUNTER — Other Ambulatory Visit: Payer: Self-pay

## 2023-02-15 ENCOUNTER — Emergency Department (HOSPITAL_COMMUNITY): Payer: Medicare Other

## 2023-02-15 ENCOUNTER — Inpatient Hospital Stay (HOSPITAL_COMMUNITY)
Admission: EM | Admit: 2023-02-15 | Discharge: 2023-02-24 | DRG: 492 | Disposition: A | Discharge status: Skilled Nursing Facility | Payer: Medicare Other | Source: Ambulatory Visit | Attending: Family Medicine | Admitting: Family Medicine

## 2023-02-15 DIAGNOSIS — I953 Hypotension of hemodialysis: Secondary | ICD-10-CM | POA: Diagnosis not present

## 2023-02-15 DIAGNOSIS — D7589 Other specified diseases of blood and blood-forming organs: Secondary | ICD-10-CM | POA: Diagnosis present

## 2023-02-15 DIAGNOSIS — C50912 Malignant neoplasm of unspecified site of left female breast: Secondary | ICD-10-CM | POA: Diagnosis present

## 2023-02-15 DIAGNOSIS — Z885 Allergy status to narcotic agent status: Secondary | ICD-10-CM

## 2023-02-15 DIAGNOSIS — I4891 Unspecified atrial fibrillation: Secondary | ICD-10-CM | POA: Diagnosis not present

## 2023-02-15 DIAGNOSIS — Z992 Dependence on renal dialysis: Secondary | ICD-10-CM | POA: Diagnosis not present

## 2023-02-15 DIAGNOSIS — F39 Unspecified mood [affective] disorder: Secondary | ICD-10-CM | POA: Diagnosis present

## 2023-02-15 DIAGNOSIS — E1122 Type 2 diabetes mellitus with diabetic chronic kidney disease: Secondary | ICD-10-CM | POA: Diagnosis present

## 2023-02-15 DIAGNOSIS — Y9301 Activity, walking, marching and hiking: Secondary | ICD-10-CM | POA: Diagnosis present

## 2023-02-15 DIAGNOSIS — E039 Hypothyroidism, unspecified: Secondary | ICD-10-CM | POA: Diagnosis present

## 2023-02-15 DIAGNOSIS — S60211A Contusion of right wrist, initial encounter: Secondary | ICD-10-CM | POA: Diagnosis present

## 2023-02-15 DIAGNOSIS — L89321 Pressure ulcer of left buttock, stage 1: Secondary | ICD-10-CM | POA: Diagnosis present

## 2023-02-15 DIAGNOSIS — I10 Essential (primary) hypertension: Secondary | ICD-10-CM | POA: Diagnosis present

## 2023-02-15 DIAGNOSIS — E785 Hyperlipidemia, unspecified: Secondary | ICD-10-CM | POA: Diagnosis present

## 2023-02-15 DIAGNOSIS — I480 Paroxysmal atrial fibrillation: Secondary | ICD-10-CM | POA: Diagnosis present

## 2023-02-15 DIAGNOSIS — Z7982 Long term (current) use of aspirin: Secondary | ICD-10-CM | POA: Diagnosis not present

## 2023-02-15 DIAGNOSIS — E2740 Unspecified adrenocortical insufficiency: Secondary | ICD-10-CM | POA: Diagnosis present

## 2023-02-15 DIAGNOSIS — I2510 Atherosclerotic heart disease of native coronary artery without angina pectoris: Secondary | ICD-10-CM | POA: Diagnosis present

## 2023-02-15 DIAGNOSIS — J449 Chronic obstructive pulmonary disease, unspecified: Secondary | ICD-10-CM | POA: Diagnosis present

## 2023-02-15 DIAGNOSIS — Z880 Allergy status to penicillin: Secondary | ICD-10-CM

## 2023-02-15 DIAGNOSIS — D62 Acute posthemorrhagic anemia: Secondary | ICD-10-CM | POA: Diagnosis not present

## 2023-02-15 DIAGNOSIS — I120 Hypertensive chronic kidney disease with stage 5 chronic kidney disease or end stage renal disease: Secondary | ICD-10-CM | POA: Diagnosis present

## 2023-02-15 DIAGNOSIS — R000 Tachycardia, unspecified: Secondary | ICD-10-CM | POA: Diagnosis not present

## 2023-02-15 DIAGNOSIS — Z79890 Hormone replacement therapy (postmenopausal): Secondary | ICD-10-CM | POA: Diagnosis not present

## 2023-02-15 DIAGNOSIS — Z7951 Long term (current) use of inhaled steroids: Secondary | ICD-10-CM

## 2023-02-15 DIAGNOSIS — D631 Anemia in chronic kidney disease: Secondary | ICD-10-CM | POA: Diagnosis present

## 2023-02-15 DIAGNOSIS — E669 Obesity, unspecified: Secondary | ICD-10-CM | POA: Diagnosis present

## 2023-02-15 DIAGNOSIS — H9190 Unspecified hearing loss, unspecified ear: Secondary | ICD-10-CM | POA: Diagnosis present

## 2023-02-15 DIAGNOSIS — Y92538 Other ambulatory health services establishments as the place of occurrence of the external cause: Secondary | ICD-10-CM | POA: Diagnosis present

## 2023-02-15 DIAGNOSIS — F05 Delirium due to known physiological condition: Secondary | ICD-10-CM | POA: Diagnosis not present

## 2023-02-15 DIAGNOSIS — R262 Difficulty in walking, not elsewhere classified: Secondary | ICD-10-CM | POA: Diagnosis present

## 2023-02-15 DIAGNOSIS — S82851A Displaced trimalleolar fracture of right lower leg, initial encounter for closed fracture: Secondary | ICD-10-CM | POA: Diagnosis present

## 2023-02-15 DIAGNOSIS — M25571 Pain in right ankle and joints of right foot: Secondary | ICD-10-CM | POA: Diagnosis present

## 2023-02-15 DIAGNOSIS — Z794 Long term (current) use of insulin: Secondary | ICD-10-CM | POA: Diagnosis not present

## 2023-02-15 DIAGNOSIS — Z888 Allergy status to other drugs, medicaments and biological substances status: Secondary | ICD-10-CM

## 2023-02-15 DIAGNOSIS — S40022A Contusion of left upper arm, initial encounter: Secondary | ICD-10-CM | POA: Diagnosis present

## 2023-02-15 DIAGNOSIS — M898X9 Other specified disorders of bone, unspecified site: Secondary | ICD-10-CM | POA: Diagnosis present

## 2023-02-15 DIAGNOSIS — T8241XA Breakdown (mechanical) of vascular dialysis catheter, initial encounter: Secondary | ICD-10-CM | POA: Diagnosis not present

## 2023-02-15 DIAGNOSIS — Y712 Prosthetic and other implants, materials and accessory cardiovascular devices associated with adverse incidents: Secondary | ICD-10-CM | POA: Diagnosis not present

## 2023-02-15 DIAGNOSIS — Z833 Family history of diabetes mellitus: Secondary | ICD-10-CM

## 2023-02-15 DIAGNOSIS — Z6833 Body mass index (BMI) 33.0-33.9, adult: Secondary | ICD-10-CM

## 2023-02-15 DIAGNOSIS — Z8249 Family history of ischemic heart disease and other diseases of the circulatory system: Secondary | ICD-10-CM

## 2023-02-15 DIAGNOSIS — K567 Ileus, unspecified: Secondary | ICD-10-CM | POA: Diagnosis not present

## 2023-02-15 DIAGNOSIS — E7800 Pure hypercholesterolemia, unspecified: Secondary | ICD-10-CM | POA: Diagnosis present

## 2023-02-15 DIAGNOSIS — Z79899 Other long term (current) drug therapy: Secondary | ICD-10-CM

## 2023-02-15 DIAGNOSIS — N186 End stage renal disease: Secondary | ICD-10-CM | POA: Diagnosis present

## 2023-02-15 DIAGNOSIS — Z79811 Long term (current) use of aromatase inhibitors: Secondary | ICD-10-CM

## 2023-02-15 LAB — BASIC METABOLIC PANEL
Anion gap: 10 (ref 5–15)
BUN: 21 mg/dL (ref 8–23)
CO2: 25 mmol/L (ref 22–32)
Calcium: 8.6 mg/dL — ABNORMAL LOW (ref 8.9–10.3)
Chloride: 101 mmol/L (ref 98–111)
Creatinine, Ser: 2.36 mg/dL — ABNORMAL HIGH (ref 0.44–1.00)
GFR, Estimated: 20 mL/min — ABNORMAL LOW (ref >60)
Glucose, Bld: 81 mg/dL (ref 70–99)
Potassium: 5.4 mmol/L — ABNORMAL HIGH (ref 3.5–5.1)
Sodium: 136 mmol/L (ref 135–145)

## 2023-02-15 LAB — CBC WITH DIFFERENTIAL/PLATELET
Abs Immature Granulocytes: 0.08 10*3/uL — ABNORMAL HIGH (ref 0.00–0.07)
Basophils Absolute: 0.1 10*3/uL (ref 0.0–0.1)
Basophils Relative: 1 %
Eosinophils Absolute: 0.3 10*3/uL (ref 0.0–0.5)
Eosinophils Relative: 3 %
HCT: 41.5 % (ref 36.0–46.0)
Hemoglobin: 12.2 g/dL (ref 12.0–15.0)
Immature Granulocytes: 1 %
Lymphocytes Relative: 17 %
Lymphs Abs: 1.5 10*3/uL (ref 0.7–4.0)
MCH: 30.7 pg (ref 26.0–34.0)
MCHC: 29.4 g/dL — ABNORMAL LOW (ref 30.0–36.0)
MCV: 104.3 fL — ABNORMAL HIGH (ref 80.0–100.0)
Monocytes Absolute: 0.7 10*3/uL (ref 0.1–1.0)
Monocytes Relative: 8 %
Neutro Abs: 6.3 10*3/uL (ref 1.7–7.7)
Neutrophils Relative %: 70 %
Platelets: 183 10*3/uL (ref 150–400)
RBC: 3.98 MIL/uL (ref 3.87–5.11)
RDW: 17.2 % — ABNORMAL HIGH (ref 11.5–15.5)
WBC: 9 10*3/uL (ref 4.0–10.5)
nRBC: 0 % (ref 0.0–0.2)

## 2023-02-15 MED ORDER — ONDANSETRON HCL 4 MG/2ML IJ SOLN
4.0000 mg | Freq: Once | INTRAMUSCULAR | Status: AC
  Administered 2023-02-15: 4 mg via INTRAVENOUS
  Filled 2023-02-15: qty 2

## 2023-02-15 MED ORDER — ANASTROZOLE 1 MG PO TABS: 1.0000 mg | ORAL_TABLET | Freq: Every evening | ORAL | Status: DC

## 2023-02-15 MED ORDER — ONDANSETRON HCL 4 MG/2ML IJ SOLN
4.0000 mg | Freq: Four times a day (QID) | INTRAMUSCULAR | Status: DC | PRN
  Administered 2023-02-16 – 2023-02-17 (×3): 4 mg via INTRAVENOUS
  Filled 2023-02-15 (×3): qty 2

## 2023-02-15 MED ORDER — ACETAMINOPHEN 650 MG RE SUPP: 650.0000 mg | Freq: Four times a day (QID) | RECTAL | Status: DC | PRN

## 2023-02-15 MED ORDER — ONDANSETRON HCL 4 MG PO TABS: 4.0000 mg | ORAL_TABLET | Freq: Four times a day (QID) | ORAL | Status: DC | PRN

## 2023-02-15 MED ORDER — HYDROMORPHONE HCL 1 MG/ML IJ SOLN
1.0000 mg | Freq: Once | INTRAMUSCULAR | Status: AC
  Administered 2023-02-15: 1 mg via INTRAVENOUS
  Filled 2023-02-15: qty 1

## 2023-02-15 MED ORDER — MORPHINE SULFATE (PF) 2 MG/ML IV SOLN
2.0000 mg | INTRAVENOUS | Status: DC | PRN
  Administered 2023-02-16 (×2): 2 mg via INTRAVENOUS
  Filled 2023-02-15 (×2): qty 1

## 2023-02-15 MED ORDER — EZETIMIBE 10 MG PO TABS
10.0000 mg | ORAL_TABLET | Freq: Every day | ORAL | Status: DC
  Administered 2023-02-16 – 2023-02-18 (×3): 10 mg via ORAL
  Filled 2023-02-15 (×4): qty 1

## 2023-02-15 MED ORDER — MORPHINE SULFATE (PF) 4 MG/ML IV SOLN
4.0000 mg | Freq: Once | INTRAVENOUS | Status: AC
  Administered 2023-02-15: 4 mg via INTRAVENOUS
  Filled 2023-02-15: qty 1

## 2023-02-15 MED ORDER — TRAZODONE HCL 50 MG PO TABS
25.0000 mg | ORAL_TABLET | Freq: Every evening | ORAL | Status: DC | PRN
  Filled 2023-02-15 (×2): qty 1

## 2023-02-15 MED ORDER — SODIUM CHLORIDE 0.9 % IV SOLN: INTRAVENOUS | Status: DC

## 2023-02-15 MED ORDER — SIMVASTATIN 20 MG PO TABS
40.0000 mg | ORAL_TABLET | Freq: Every evening | ORAL | Status: DC
  Administered 2023-02-16 – 2023-02-17 (×3): 40 mg via ORAL
  Filled 2023-02-15 (×3): qty 2

## 2023-02-15 MED ORDER — CITALOPRAM HYDROBROMIDE 20 MG PO TABS
40.0000 mg | ORAL_TABLET | Freq: Every day | ORAL | Status: DC
  Administered 2023-02-16: 40 mg via ORAL
  Filled 2023-02-15 (×2): qty 2

## 2023-02-15 MED ORDER — ADULT MULTIVITAMIN W/MINERALS CH
1.0000 | ORAL_TABLET | Freq: Every evening | ORAL | Status: DC
  Administered 2023-02-16 – 2023-02-22 (×6): 1 via ORAL
  Filled 2023-02-15 (×6): qty 1

## 2023-02-15 MED ORDER — ACETAMINOPHEN 325 MG PO TABS
650.0000 mg | ORAL_TABLET | Freq: Four times a day (QID) | ORAL | Status: DC | PRN
  Administered 2023-02-21: 650 mg via ORAL
  Filled 2023-02-15: qty 2

## 2023-02-15 MED ORDER — FLUTICASONE FUROATE-VILANTEROL 100-25 MCG/INH IN AEPB: 1.0000 | INHALATION_SPRAY | Freq: Every day | RESPIRATORY_TRACT | Status: DC

## 2023-02-15 MED ORDER — ALBUTEROL SULFATE (2.5 MG/3ML) 0.083% IN NEBU: 2.5000 mg | INHALATION_SOLUTION | Freq: Four times a day (QID) | RESPIRATORY_TRACT | Status: DC | PRN

## 2023-02-15 MED ORDER — MAGNESIUM HYDROXIDE 400 MG/5ML PO SUSP: 30.0000 mL | Freq: Every day | ORAL | Status: DC | PRN

## 2023-02-15 MED ORDER — ENOXAPARIN SODIUM 30 MG/0.3ML IJ SOSY
30.0000 mg | PREFILLED_SYRINGE | INTRAMUSCULAR | Status: DC
  Filled 2023-02-15 (×2): qty 0.3

## 2023-02-15 MED ORDER — FLUTICASONE FUROATE-VILANTEROL 200-25 MCG/ACT IN AEPB
1.0000 | INHALATION_SPRAY | Freq: Every day | RESPIRATORY_TRACT | Status: DC
  Administered 2023-02-16 – 2023-02-24 (×6): 1 via RESPIRATORY_TRACT
  Filled 2023-02-15: qty 28

## 2023-02-15 NOTE — H&P (Incomplete)
[place]   PATIENT NAME: Kayla Vasquez    MR#:  [id number]  DATE OF BIRTH:  [birth year]  DATE OF ADMISSION:  02/15/2023  PRIMARY CARE PHYSICIAN: Pcp, No   Patient is coming from: Home  REQUESTING/REFERRING PHYSICIAN: Jacalyn Lefevre, MD  CHIEF COMPLAINT:   Chief Complaint  Patient presents with   Fall    HISTORY OF PRESENT ILLNESS:  Kayla Vasquez is a 82 y.o. Caucasian female with medical history significant for dyslipidemia and ESRD on TTS, who presented to the emergency room with acute onset of accidental mechanical fall when she tripped over steps and had subsequent left posterior arm hematoma as well as right wrist and trauma with severe right ankle pain and inability to ambulate.  She denies any paresthesias or focal muscle weakness.  No presyncope or syncope.  No headache or dizziness or blurred vision.  No dysuria oliguria or hematuria or flank pain.  No cough or wheezing hemoptysis.  No chest pain or palpitations.  ED Course: Upon presentation to the emergency room, vital signs were within normal.  Later BP was 152/63 with otherwise normal vital signs.  Labs revealed potassium of 5.4 and a creatinine of 2.36 above previous levels with calcium 8.6 CBC showed macrocytosis  Imaging: Right ankle x-ray showed trimalleolar fracture with ankle joint normally aligned.  2 view chest x-ray showed small left pleural effusion with no acute cardiopulmonary disease left humerus x-ray showed no fracture pelvic x-ray showed negative findings for fracture.  The patient was given 1 mg of IV Dilaudid, 4 mg of IV morphine sulfate twice and 4 mg of IV Zofran.  She will be admitted to a medical-surgical bed for further evaluation and management. 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 All pertinent systems were reviewed above. Constitutional, HEENT, cardiovascular, respiratory, GI, GU, musculoskeletal, neuro, psychiatric, endocrine, integumentary and hematologic systems were reviewed and are otherwise negative/unremarkable except for positive findings mentioned above in the HPI.   MEDICATIONS AT HOME:   Prior to Admission medications   Medication Sig Start Date End Date Taking? Authorizing Provider  albuterol (PROVENTIL HFA) 108 (90 Base) MCG/ACT inhaler Inhale 2 puffs into the lungs every 6 (six) hours as needed for wheezing. 05/16/17   [provider]  anastrozole (ARIMIDEX) 1 MG tablet Take 1 mg by mouth every evening. 01/06/17   [provider]  aspirin 325 MG tablet Take 325 mg by mouth daily.     [provider]  CALCIUM PO Take 1 tablet by mouth every evening.    [provider]  citalopram (CELEXA) 10 MG tablet Take 10  mg by mouth daily.    [provider]  ezetimibe (ZETIA) 10 MG tablet Take 10 mg by mouth daily.    [provider]  fluticasone furoate-vilanterol (BREO ELLIPTA) 100-25 MCG/INH AEPB Inhale 1 puff into the lungs daily. 07/17/18   [provider]  ibuprofen (ADVIL,MOTRIN) 200 MG tablet Take 400 mg by mouth every 6 (six) hours as needed for mild pain.    [provider]  INCRUSE ELLIPTA 62.5 MCG/INH AEPB Inhale 1 puff into the lungs daily. 07/17/18   [provider]  Multiple Vitamin (MULTI-VITAMINS) TABS Take 1 tablet by mouth every evening.    [provider]  Multiple Vitamins-Minerals (HAIR SKIN AND NAILS FORMULA PO) Take 1 tablet by mouth every evening.    [provider]  predniSONE (DELTASONE) 10 MG tablet Take 2 tabs twice daily for 3 days then take 2 tabs in am and 1 tab in evening for 30 days 08/22/18   Gwenyth Bender, NP  simvastatin (ZOCOR) 40 MG tablet Take 40 mg by mouth every evening.     [provider]  zolpidem (AMBIEN) 5 MG tablet Take 5 mg by mouth at bedtime.     [provider]      VITAL SIGNS:  Blood pressure (!) 152/63, pulse 77, temperature 97.6 F (36.4 C), temperature source Oral, resp. rate 17, height 5\' 6"  (1.676 m), weight 92 kg, SpO2 93 %.  PHYSICAL EXAMINATION:  Physical Exam  GENERAL:  82 y.o.-year-old Caucasian female patient lying in the bed with no acute distress.  EYES: Pupils equal, round, reactive to light and accommodation. No scleral icterus. Extraocular muscles intact.  HEENT: Head atraumatic, normocephalic. Oropharynx and nasopharynx clear.  NECK:  Supple, no jugular venous distention. No thyroid enlargement, no tenderness.  LUNGS: Normal breath sounds bilaterally, no wheezing, rales,rhonchi or crepitation. No use of accessory muscles of respiration.  CARDIOVASCULAR: Regular rate and rhythm, S1, S2 normal. No murmurs, rubs, or gallops.  ABDOMEN: Soft, nondistended, nontender. Bowel sounds present. No organomegaly or mass.  EXTREMITIES: No pedal edema, cyanosis, or clubbing. Musculoskeletal: Right ankle in splint. NEUROLOGIC: Cranial nerves II through XII are intact. Muscle strength 5/5 in all extremities. Sensation intact. Gait not checked.  PSYCHIATRIC: The patient is alert and oriented x 3.  Normal affect and good eye contact. SKIN: Right wrist hematoma as well as left posterior arm hematoma.  LABORATORY PANEL:   CBC Recent Labs  Lab 02/15/23 1804   WBC 9.0  HGB 12.2  HCT 41.5  PLT 183   ------------------------------------------------------------------------------------------------------------------  Chemistries  Recent Labs  Lab 02/15/23 1804  NA 136  K 5.4*  CL 101  CO2 25  GLUCOSE 81  BUN 21  CREATININE 2.36*  CALCIUM 8.6*   ------------------------------------------------------------------------------------------------------------------  Cardiac Enzymes No results for input(s): "TROPONINI" in the last 168 hours. ------------------------------------------------------------------------------------------------------------------  RADIOLOGY:  DG Pelvis 1-2 Views  Result Date: 02/15/2023 CLINICAL DATA:  Slipped and fell at the dialysis center.  Pain. EXAM: PELVIS - 1-2 VIEW COMPARISON:  None Available. FINDINGS: No fracture or bone lesion. Hip joints, SI joints and pubic symphysis are normally spaced and aligned. Soft tissues are unremarkable. IMPRESSION: Negative. Electronically Signed   By: Amie Portland M.D.   On: 02/15/2023 17:32   DG Ankle Complete Right  Result Date: 02/15/2023 CLINICAL DATA:  Larey Seat at the dialysis center.  Right ankle pain. EXAM: RIGHT ANKLE - COMPLETE 3+ VIEW COMPARISON:  None Available. FINDINGS: Oblique fracture of the distal fibula extending from  the posterior metadiaphysis to the anteromedial distal metaphysis. Posterior fracture component is mildly displaced, 3 mm, posteriorly, and foreshortened by a similar degree. No fracture comminution. There is a nondisplaced fracture across the base of the medial malleolus best appreciated on the lateral view. No fracture comminution. There is also a fracture of the posterior malleolus, superimposed on the distal fibular fracture on the lateral view, without significant displacement and without comminution. Ankle joint is normally spaced and aligned. There is surrounding soft tissue swelling. IMPRESSION: 1. Trimalleolar fracture as detailed above. Ankle joint  normally aligned. Electronically Signed   By: Amie Portland M.D.   On: 02/15/2023 17:31   DG Humerus Left  Result Date: 02/15/2023 CLINICAL DATA:  Larey Seat at the dialysis center.  Left arm pain. EXAM: LEFT HUMERUS - 2+ VIEW COMPARISON:  None Available. FINDINGS: There is no evidence of fracture or other focal bone lesions. Soft tissues are unremarkable. IMPRESSION: Negative. Electronically Signed   By: Amie Portland M.D.   On: 02/15/2023 17:28   DG Wrist Complete Right  Result Date: 02/15/2023 CLINICAL DATA:  Larey Seat at the dialysis center.  Right wrist pain. EXAM: RIGHT WRIST - COMPLETE 3+ VIEW COMPARISON:  None Available. FINDINGS: No fracture.  No bone lesion. Joints are normally spaced and aligned. There is significant dorsal soft tissue swelling. IMPRESSION: No fracture or dislocation. Electronically Signed   By: Amie Portland M.D.   On: 02/15/2023 17:28   DG Chest 2 View  Result Date: 02/15/2023 CLINICAL DATA:  Larey Seat at the dialysis center. EXAM: CHEST - 2 VIEW COMPARISON:  12/30/2022. FINDINGS: Cardiac silhouette is normal in size. No mediastinal or hilar masses or evidence of adenopathy. Small left pleural effusion. Linear opacity in the left mid lung consistent with atelectasis. Remainder of the lungs is clear. No right pleural effusion. No pneumothorax. Total left internal jugular central venous catheter is stable. Skeletal structures are demineralized, but intact. IMPRESSION: 1. No acute cardiopulmonary disease. 2. Small left pleural effusion. Electronically Signed   By: Amie Portland M.D.   On: 02/15/2023 17:26      IMPRESSION AND PLAN:  Assessment and Plan: * Unable to ambulate - This is secondary to her right trimalleolar ankle fracture. - She will be admitted to a medical-surgical bed. - Pain management will be provided. - Orthopedic consult will be obtained while she is here. - Dr. Ave Filter was notified about the patient.  End-stage renal disease on hemodialysis [facility]) - Nephrology  consult to be obtained. - Dr. Glenna Fellows was notified about the patient.  Type 2 diabetes mellitus with chronic kidney disease, without long-term current use of insulin (HCC) - The patient will be placed on supplemental coverage with NovoLog  Hypothyroidism - We will continue Synthroid.  Adrenal insufficiency (HCC) - We will continue Cortef.  Dyslipidemia - We will continue statin therapy and Zetia.  COPD without exacerbation (HCC) - We will continue her inhalers and nebulized albuterol.  Essential hypertension - We will continue her antihypertensives.    DVT prophylaxis: Lovenox.  Advanced Care Planning:  Code Status: full code.  Family Communication:  The plan of care was discussed in details with the patient (and family). I answered all questions. The patient agreed to proceed with the above mentioned plan. Further management will depend upon hospital course. Disposition Plan: Back to previous home environment Consults called: Nephrolog an Ortho consult. All the records are reviewed and case discussed with ED provider.  Status is: Inpatient   At the time of the admission,  it appears that the appropriate admission status for this patient is inpatient.  This is judged to be reasonable and necessary in order to provide the required intensity of service to ensure the patient's safety given the presenting symptoms, physical exam findings and initial radiographic and laboratory data in the context of comorbid conditions.  The patient requires inpatient status due to high intensity of service, high risk of further deterioration and high frequency of surveillance required.  I certify that at the time of admission, it is my clinical judgment that the patient will require inpatient hospital care extending more than 2 midnights.                            Dispo: The patient is from: Home              Anticipated d/c is to: Home              Patient currently is not medically stable to  d/c.              Difficult to place patient: No  Hannah Beat M.D on 02/16/2023 at 3:35 AM  [facility]   From 7 PM-7 AM, contact night-coverage [email or web address]  CC: Primary care physician; Pcp, No

## 2023-02-15 NOTE — ED Triage Notes (Signed)
Pt to ed from dialysis center via ems with CC Of trip and fall. Pt slipped because it was wet. Pt denies hitting head, no blood thinners, no LOC. Pt is A&Ox4. Dialysis was completed. Pt has right ankle pain.

## 2023-02-15 NOTE — ED Provider Notes (Signed)
[place] EMERGENCY DEPARTMENT AT [facility] Provider Note   CSN: [id number] Arrival date & time: 02/15/23  1607     History  Chief Complaint  Patient presents with   Marletta Lor    Kayla Vasquez is a 82 y.o. female.  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  Pt was walking out of dialysis and slipped going up some steps b/c it's wet.  She did not hit her head.  No thinners.  She mainly has right ankle pain, but also has right wrist pain and left upper arm pain.         Home Medications Prior to Admission medications   Medication Sig Start Date End Date Taking? Authorizing Provider  albuterol (PROVENTIL HFA) 108 (90 Base) MCG/ACT inhaler Inhale 2 puffs into the lungs every 6 (six) hours as needed for wheezing. 05/16/17   [provider]  anastrozole (ARIMIDEX) 1 MG tablet Take 1 mg by mouth every evening. 01/06/17   [provider]  aspirin 325 MG tablet Take 325 mg by mouth daily.     [provider]  CALCIUM PO Take 1 tablet by mouth every evening.    [provider]  citalopram (CELEXA) 10 MG tablet Take 10 mg by mouth daily.    [provider]  ezetimibe (ZETIA) 10 MG tablet Take 10 mg by mouth daily.    [provider]  fluticasone furoate-vilanterol (BREO ELLIPTA) 100-25 MCG/INH AEPB Inhale 1 puff into the lungs daily. 07/17/18   [provider]  ibuprofen (ADVIL,MOTRIN) 200 MG tablet Take 400 mg by mouth every 6 (six) hours as needed for mild pain.    [provider]  INCRUSE ELLIPTA 62.5 MCG/INH AEPB Inhale 1 puff into the lungs daily. 07/17/18   [provider]  Multiple Vitamin (MULTI-VITAMINS) TABS Take 1 tablet by mouth every evening.    [provider]  Multiple Vitamins-Minerals (HAIR SKIN AND NAILS FORMULA PO) Take 1 tablet by mouth every evening.    [provider]  predniSONE (DELTASONE) 10 MG  tablet Take 2 tabs twice daily for 3 days then take 2 tabs in am and 1 tab in evening for 30 days 08/22/18   Gwenyth Bender, NP  simvastatin (ZOCOR) 40 MG tablet Take 40 mg by mouth every evening.     [provider]  zolpidem (AMBIEN) 5 MG tablet Take 5 mg by mouth at bedtime.     [provider]      Allergies    Codeine    Review of Systems   Review of Systems  Musculoskeletal:        Right ankle, right wrist, left arm  All other systems reviewed and are negative.   Physical Exam Updated Vital Signs BP 126/75 (BP Location: Right Arm)   Pulse 84   Temp 98.2 F (36.8 C) (Oral)   Resp 16   Ht 5\' 6"  (1.676 m)   Wt 92 kg   SpO2 98%   BMI 32.74 kg/m  Physical Exam Vitals and nursing note reviewed.  Constitutional:      Appearance: Normal appearance.  HENT:     Head: Normocephalic and atraumatic.     Right Ear: External ear normal.     Left Ear: External ear normal.     Nose: Nose normal.     Mouth/Throat:     Mouth: Mucous membranes are moist.  Pharynx: Oropharynx is clear.  Eyes:     Extraocular Movements: Extraocular movements intact.     Conjunctiva/sclera: Conjunctivae normal.     Pupils: Pupils are equal, round, and reactive to light.  Cardiovascular:     Rate and Rhythm: Normal rate and regular rhythm.     Pulses: Normal pulses.     Heart sounds: Normal heart sounds.  Pulmonary:     Effort: Pulmonary effort is normal.     Breath sounds: Normal breath sounds.  Abdominal:     General: Abdomen is flat. Bowel sounds are normal.     Palpations: Abdomen is soft.  Musculoskeletal:     Cervical back: Normal range of motion and neck supple.     Comments: Large bruise right wrist; pain right ankle, large bruise left upper arm  Skin:    General: Skin is warm.     Capillary Refill: Capillary refill takes less than 2 seconds.  Neurological:     General: No focal deficit present.     Mental Status: She is alert and oriented to person, place, and  time.  Psychiatric:        Mood and Affect: Mood normal.        Behavior: Behavior normal.     ED Results / Procedures / Treatments   Labs (all labs ordered are listed, but only abnormal results are displayed) Labs Reviewed  CBC WITH DIFFERENTIAL/PLATELET - Abnormal; Notable for the following components:      Result Value   MCV 104.3 (*)    MCHC 29.4 (*)    RDW 17.2 (*)    Abs Immature Granulocytes 0.08 (*)    All other components within normal limits  BASIC METABOLIC PANEL - Abnormal; Notable for the following components:   Potassium 5.4 (*)    Creatinine, Ser 2.36 (*)    Calcium 8.6 (*)    GFR, Estimated 20 (*)    All other components within normal limits    EKG None  Radiology DG Pelvis 1-2 Views  Result Date: 02/15/2023 CLINICAL DATA:  Slipped and fell at the dialysis center.  Pain. EXAM: PELVIS - 1-2 VIEW COMPARISON:  None Available. FINDINGS: No fracture or bone lesion. Hip joints, SI joints and pubic symphysis are normally spaced and aligned. Soft tissues are unremarkable. IMPRESSION: Negative. Electronically Signed   By: Amie Portland M.D.   On: 02/15/2023 17:32   DG Ankle Complete Right  Result Date: 02/15/2023 CLINICAL DATA:  Larey Seat at the dialysis center.  Right ankle pain. EXAM: RIGHT ANKLE - COMPLETE 3+ VIEW COMPARISON:  None Available. FINDINGS: Oblique fracture of the distal fibula extending from the posterior metadiaphysis to the anteromedial distal metaphysis. Posterior fracture component is mildly displaced, 3 mm, posteriorly, and foreshortened by a similar degree. No fracture comminution. There is a nondisplaced fracture across the base of the medial malleolus best appreciated on the lateral view. No fracture comminution. There is also a fracture of the posterior malleolus, superimposed on the distal fibular fracture on the lateral view, without significant displacement and without comminution. Ankle joint is normally spaced and aligned. There is surrounding soft  tissue swelling. IMPRESSION: 1. Trimalleolar fracture as detailed above. Ankle joint normally aligned. Electronically Signed   By: Amie Portland M.D.   On: 02/15/2023 17:31   DG Humerus Left  Result Date: 02/15/2023 CLINICAL DATA:  Larey Seat at the dialysis center.  Left arm pain. EXAM: LEFT HUMERUS - 2+ VIEW COMPARISON:  None Available. FINDINGS: There is no evidence of  fracture or other focal bone lesions. Soft tissues are unremarkable. IMPRESSION: Negative. Electronically Signed   By: Amie Portland M.D.   On: 02/15/2023 17:28   DG Wrist Complete Right  Result Date: 02/15/2023 CLINICAL DATA:  Larey Seat at the dialysis center.  Right wrist pain. EXAM: RIGHT WRIST - COMPLETE 3+ VIEW COMPARISON:  None Available. FINDINGS: No fracture.  No bone lesion. Joints are normally spaced and aligned. There is significant dorsal soft tissue swelling. IMPRESSION: No fracture or dislocation. Electronically Signed   By: Amie Portland M.D.   On: 02/15/2023 17:28   DG Chest 2 View  Result Date: 02/15/2023 CLINICAL DATA:  Larey Seat at the dialysis center. EXAM: CHEST - 2 VIEW COMPARISON:  12/30/2022. FINDINGS: Cardiac silhouette is normal in size. No mediastinal or hilar masses or evidence of adenopathy. Small left pleural effusion. Linear opacity in the left mid lung consistent with atelectasis. Remainder of the lungs is clear. No right pleural effusion. No pneumothorax. Total left internal jugular central venous catheter is stable. Skeletal structures are demineralized, but intact. IMPRESSION: 1. No acute cardiopulmonary disease. 2. Small left pleural effusion. Electronically Signed   By: Amie Portland M.D.   On: 02/15/2023 17:26    Procedures .Ortho Injury Treatment  Date/Time: 02/15/2023 6:59 PM  Performed by: Jacalyn Lefevre, MD Authorized by: Jacalyn Lefevre, MD   Consent:    Consent obtained:  Verbal   Consent given by:  Patient   Risks discussed:  Fracture   Alternatives discussed:  No treatmentInjury location:  ankle Location details: right ankle Injury type: fracture Fracture type: trimalleolar Pre-procedure neurovascular assessment: neurovascularly intact Pre-procedure distal perfusion: normal Pre-procedure neurological function: normal Pre-procedure range of motion: normal  Anesthesia: Local anesthesia used: no  Patient sedated: NoImmobilization: splint Splint type: short leg and ankle stirrup Splint Applied by: ED Tech Supplies used: cotton padding, elastic bandage and Ortho-Glass Post-procedure neurovascular assessment: post-procedure neurovascularly intact Post-procedure distal perfusion: normal Post-procedure neurological function: normal Post-procedure range of motion: normal       Medications Ordered in ED Medications  morphine (PF) 4 MG/ML injection 4 mg (4 mg Intravenous Given 02/15/23 1811)  ondansetron (ZOFRAN) injection 4 mg (4 mg Intravenous Given 02/15/23 1811)  morphine (PF) 4 MG/ML injection 4 mg (4 mg Intravenous Given 02/15/23 2023)  HYDROmorphone (DILAUDID) injection 1 mg (1 mg Intravenous Given 02/15/23 2117)    ED Course/ Medical Decision Making/ A&P                             Medical Decision Making Amount and/or Complexity of Data Reviewed Labs: ordered. Radiology: ordered.  Risk Prescription drug management. Decision regarding hospitalization.   This patient presents to the ED for concern of fall, this involves an extensive number of treatment options, and is a complaint that carries with it a high risk of complications and morbidity.  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  The pertinent  results include:  cbc nl, bmp nl other than cr 2.36   Imaging Studies ordered:  I ordered imaging studies including pelvis, ankle, humerus, wrist, chest  I independently visualized and interpreted imaging which showed  Pelvis: Negative.  Ankle: 1. Trimalleolar fracture as detailed above. Ankle joint normally  aligned.  Humerus: Negative.  Wrist: No fracture or dislocation.  CXR:  No acute cardiopulmonary disease.  2. Small left pleural effusion.   I agree with the radiologist interpretation   Cardiac Monitoring:  The patient was maintained on a cardiac monitor.  I personally viewed and interpreted the cardiac monitored which showed an underlying rhythm of: nsr   Medicines ordered and prescription drug management:  I ordered medication including morphine/zofran  for sx  Reevaluation of the patient after these medicines showed that the patient improved I have reviewed the patients home medicines and have made adjustments as needed    Critical Interventions:  Pain control   Consultations Obtained:  I requested consultation with the orthopedist (Dr. Ave Filter),  and discussed lab and imaging findings as well as pertinent plan - he recommends splint and f/u with Dr. Susa Simmonds this week.  NWB right leg. Pt d/w Dr. Arville Care ([facility]) who will admit   Problem List / ED Course:  Trimalleolar fracture right ankle:  Pt lives alone.  Family available to help, but their houses are not wheelchair accessible.  Pain is severe.  Pt d/w SW and she needs a 3 day stay for rehab.  She will need admission for pain control and rehab.   Reevaluation:  After the interventions noted above, I reevaluated the patient and found that they have :improved   Social Determinants of Health:  Lives alone   Dispostion:  After consideration of the diagnostic results and the patients response to treatment, I feel that the patent would benefit from admission.          Final Clinical  Impression(s) / ED Diagnoses Final diagnoses:  Closed trimalleolar fracture of right ankle, initial encounter  Hematoma of right wrist  Hematoma of arm, left, initial encounter  ESRD on hemodialysis [facility])    Rx / DC Orders ED Discharge Orders     None         Jacalyn Lefevre, MD 02/15/23 2121

## 2023-02-15 NOTE — Progress Notes (Signed)
Pt does not meet criteria for SNF placement.  Pt would need a 3 night stay as a inpatient, due to Medicare guidelines.    Shlomie Romig Tarpley-Carter, MSW, LCSW-A Pronouns:  She/Her/Hers [facility] HealthTransitions of Care Clinical Social Worker Direct Number:  [phone] [email or web address]

## 2023-02-15 NOTE — Progress Notes (Signed)
Orthopedic Tech Progress Note Patient Details:  Kayla Vasquez 1941/07/31 [id number]  Ortho Devices Type of Ortho Device: Short leg splint, Stirrup splint Ortho Device/Splint Location: RLE Ortho Device/Splint Interventions: Ordered, Application, Adjustment   Post Interventions Patient Tolerated: Well Instructions Provided: Care of device  Donald Pore 02/15/2023, 7:00 PM

## 2023-02-15 NOTE — ED Notes (Signed)
ED TO INPATIENT HANDOFF REPORT  ED Nurse Name and Phone #:  Marcello Moores [phone]  S Name/Age/Gender Kayla Vasquez 81 y.o. female Room/Bed: H020C/H020C  Code Status   Code Status: Prior  Home/SNF/Other Home Patient oriented to: self, place, time, and situation Is this baseline? Yes   Triage Complete: Triage complete  Chief Complaint Unable to ambulate [R26.2]  Triage Note Pt to ed from dialysis center via ems with CC Of trip and fall. Pt slipped because it was wet. Pt denies hitting head, no blood thinners, no LOC. Pt is A&Ox4. Dialysis was completed. Pt has right ankle pain.    Allergies Allergies  Allergen Reactions   Codeine Nausea And Vomiting    Level of Care/Admitting Diagnosis ED Disposition     ED Disposition  Admit   Condition  --   Comment  Hospital Area: [facility] [[id number]]  Level of Care: Med-Surg [16]  May admit patient to [facility] or [facility] if equivalent level of care is available:: No  Covid Evaluation: Asymptomatic - no recent exposure (last 10 days) testing not required  Diagnosis: Unable to ambulate [3329518]  Admitting Physician: Hannah Beat [[id number]]  Attending Physician: Hannah Beat [[id number]]  Certification:: I certify this patient will need inpatient services for at least 2 midnights  Estimated Length of Stay: 2          B Medical/Surgery History Past Medical History:  Diagnosis Date   Cancer (HCC)    High cholesterol    Past Surgical History:  Procedure Laterality Date   BREAST SURGERY       A IV Location/Drains/Wounds Patient Lines/Drains/Airways Status     Active Line/Drains/Airways     Name Placement date Placement time Site Days   Peripheral IV 02/15/23 20 G Anterior;Proximal;Right Forearm 02/15/23  1811  Forearm  less than 1            Intake/Output Last 24 hours No intake or output data in the 24 hours ending 02/15/23 2133  Labs/Imaging Results for orders placed or performed during  the hospital encounter of 02/15/23 (from the past 48 hour(s))  CBC with Differential     Status: Abnormal   Collection Time: 02/15/23  6:04 PM  Result Value Ref Range   WBC 9.0 4.0 - 10.5 K/uL   RBC 3.98 3.87 - 5.11 MIL/uL   Hemoglobin 12.2 12.0 - 15.0 g/dL   HCT 93.2 35.5 - 73.2 %   MCV 104.3 (H) 80.0 - 100.0 fL   MCH 30.7 26.0 - 34.0 pg   MCHC 29.4 (L) 30.0 - 36.0 g/dL   RDW 20.2 (H) 54.2 - 70.6 %   Platelets 183 150 - 400 K/uL   nRBC 0.0 0.0 - 0.2 %   Neutrophils Relative % 70 %   Neutro Abs 6.3 1.7 - 7.7 K/uL   Lymphocytes Relative 17 %   Lymphs Abs 1.5 0.7 - 4.0 K/uL   Monocytes Relative 8 %   Monocytes Absolute 0.7 0.1 - 1.0 K/uL   Eosinophils Relative 3 %   Eosinophils Absolute 0.3 0.0 - 0.5 K/uL   Basophils Relative 1 %   Basophils Absolute 0.1 0.0 - 0.1 K/uL   Immature Granulocytes 1 %   Abs Immature Granulocytes 0.08 (H) 0.00 - 0.07 K/uL    Comment: Performed at [facility], [address] [place], [place] [address]  Basic metabolic panel     Status: Abnormal   Collection Time: 02/15/23  6:04 PM  Result Value Ref Range   Sodium 136 135 - 145 mmol/L   Potassium 5.4 (H) 3.5 - 5.1 mmol/L    Comment: HEMOLYSIS AT THIS LEVEL MAY AFFECT RESULT   Chloride 101 98 - 111 mmol/L   CO2 25 22 - 32 mmol/L   Glucose, Bld 81 70 - 99 mg/dL    Comment: Glucose reference range applies only to samples taken after fasting for at least 8 hours.   BUN 21 8 - 23 mg/dL   Creatinine, Ser 0.10 (H) 0.44 - 1.00 mg/dL   Calcium 8.6 (L) 8.9 - 10.3 mg/dL   GFR, Estimated 20 (L) >60 mL/min    Comment: (NOTE) Calculated using the CKD-EPI Creatinine Equation (2021)    Anion gap 10 5 - 15    Comment: Performed at [facility], [address], [place], [place] [address]   DG Pelvis 1-2 Views  Result Date: 02/15/2023 CLINICAL DATA:  Slipped and fell at the dialysis center.  Pain. EXAM: PELVIS - 1-2 VIEW COMPARISON:  None Available. FINDINGS: No fracture or bone lesion. Hip joints,  SI joints and pubic symphysis are normally spaced and aligned. Soft tissues are unremarkable. IMPRESSION: Negative. Electronically Signed   By: Amie Portland M.D.   On: 02/15/2023 17:32   DG Ankle Complete Right  Result Date: 02/15/2023 CLINICAL DATA:  Larey Seat at the dialysis center.  Right ankle pain. EXAM: RIGHT ANKLE - COMPLETE 3+ VIEW COMPARISON:  None Available. FINDINGS: Oblique fracture of the distal fibula extending from the posterior metadiaphysis to the anteromedial distal metaphysis. Posterior fracture component is mildly displaced, 3 mm, posteriorly, and foreshortened by a similar degree. No fracture comminution. There is a nondisplaced fracture across the base of the medial malleolus best appreciated on the lateral view. No fracture comminution. There is also a fracture of the posterior malleolus, superimposed on the distal fibular fracture on the lateral view, without significant displacement and without comminution. Ankle joint is normally spaced and aligned. There is surrounding soft tissue swelling. IMPRESSION: 1. Trimalleolar fracture as detailed above. Ankle joint normally aligned. Electronically Signed   By: Amie Portland M.D.   On: 02/15/2023 17:31   DG Humerus Left  Result Date: 02/15/2023 CLINICAL DATA:  Larey Seat at the dialysis center.  Left arm pain. EXAM: LEFT HUMERUS - 2+ VIEW COMPARISON:  None Available. FINDINGS: There is no evidence of fracture or other focal bone lesions. Soft tissues are unremarkable. IMPRESSION: Negative. Electronically Signed   By: Amie Portland M.D.   On: 02/15/2023 17:28   DG Wrist Complete Right  Result Date: 02/15/2023 CLINICAL DATA:  Larey Seat at the dialysis center.  Right wrist pain. EXAM: RIGHT WRIST - COMPLETE 3+ VIEW COMPARISON:  None Available. FINDINGS: No fracture.  No bone lesion. Joints are normally spaced and aligned. There is significant dorsal soft tissue swelling. IMPRESSION: No fracture or dislocation. Electronically Signed   By: Amie Portland  M.D.   On: 02/15/2023 17:28   DG Chest 2 View  Result Date: 02/15/2023 CLINICAL DATA:  Larey Seat at the dialysis center. EXAM: CHEST - 2 VIEW COMPARISON:  12/30/2022. FINDINGS: Cardiac silhouette is normal in size. No mediastinal or hilar masses or evidence of adenopathy. Small left pleural effusion. Linear opacity in the left mid lung consistent with atelectasis. Remainder of the lungs is clear. No right pleural effusion. No pneumothorax. Total left internal jugular central venous catheter is stable. Skeletal structures are demineralized, but intact. IMPRESSION: 1. No acute cardiopulmonary disease. 2. Small left pleural  effusion. Electronically Signed   By: Amie Portland M.D.   On: 02/15/2023 17:26    Pending Labs Unresulted Labs (From admission, onward)    None       Vitals/Pain Today's Vitals   02/15/23 1939 02/15/23 2024 02/15/23 2106 02/15/23 2117  BP:      Pulse:      Resp:      Temp:  98.2 F (36.8 C)    TempSrc:  Oral    SpO2:      Weight:      Height:      PainSc: 8   10-Worst pain ever 10-Worst pain ever    Isolation Precautions No active isolations  Medications Medications  morphine (PF) 4 MG/ML injection 4 mg (4 mg Intravenous Given 02/15/23 1811)  ondansetron (ZOFRAN) injection 4 mg (4 mg Intravenous Given 02/15/23 1811)  morphine (PF) 4 MG/ML injection 4 mg (4 mg Intravenous Given 02/15/23 2023)  HYDROmorphone (DILAUDID) injection 1 mg (1 mg Intravenous Given 02/15/23 2117)    Mobility Walks (normally). After injury has not     Focused Assessments    R Recommendations: See Admitting Provider Note  Report given to:   Additional Notes:

## 2023-02-16 DIAGNOSIS — S82851A Displaced trimalleolar fracture of right lower leg, initial encounter for closed fracture: Secondary | ICD-10-CM | POA: Diagnosis not present

## 2023-02-16 DIAGNOSIS — E785 Hyperlipidemia, unspecified: Secondary | ICD-10-CM | POA: Diagnosis present

## 2023-02-16 DIAGNOSIS — E1122 Type 2 diabetes mellitus with diabetic chronic kidney disease: Secondary | ICD-10-CM | POA: Diagnosis present

## 2023-02-16 DIAGNOSIS — E039 Hypothyroidism, unspecified: Secondary | ICD-10-CM | POA: Diagnosis present

## 2023-02-16 DIAGNOSIS — N186 End stage renal disease: Secondary | ICD-10-CM

## 2023-02-16 DIAGNOSIS — E2740 Unspecified adrenocortical insufficiency: Secondary | ICD-10-CM | POA: Diagnosis present

## 2023-02-16 DIAGNOSIS — J449 Chronic obstructive pulmonary disease, unspecified: Secondary | ICD-10-CM | POA: Diagnosis present

## 2023-02-16 DIAGNOSIS — I10 Essential (primary) hypertension: Secondary | ICD-10-CM | POA: Diagnosis present

## 2023-02-16 LAB — GLUCOSE, CAPILLARY
Glucose-Capillary: 111 mg/dL — ABNORMAL HIGH (ref 70–99)
Glucose-Capillary: 127 mg/dL — ABNORMAL HIGH (ref 70–99)
Glucose-Capillary: 199 mg/dL — ABNORMAL HIGH (ref 70–99)
Glucose-Capillary: 146 mg/dL — ABNORMAL HIGH (ref 70–99)

## 2023-02-16 LAB — SURGICAL PCR SCREEN
MRSA, PCR: NEGATIVE
Staphylococcus aureus: NEGATIVE

## 2023-02-16 MED ORDER — HYDROMORPHONE HCL 1 MG/ML IJ SOLN
0.5000 mg | INTRAMUSCULAR | Status: DC | PRN
  Administered 2023-02-16 – 2023-02-17 (×4): 0.5 mg via INTRAVENOUS
  Filled 2023-02-16 (×4): qty 0.5

## 2023-02-16 MED ORDER — DILTIAZEM HCL-DEXTROSE 125-5 MG/125ML-% IV SOLN (PREMIX)
5.0000 mg/h | INTRAVENOUS | Status: DC
  Administered 2023-02-16: 5 mg/h via INTRAVENOUS
  Filled 2023-02-16 (×2): qty 125

## 2023-02-16 MED ORDER — HYDROMORPHONE HCL 1 MG/ML IJ SOLN
0.5000 mg | Freq: Once | INTRAMUSCULAR | Status: AC
  Administered 2023-02-16: 0.5 mg via INTRAVENOUS
  Filled 2023-02-16: qty 0.5

## 2023-02-16 MED ORDER — INSULIN ASPART 100 UNIT/ML IJ SOLN: 0.0000 [IU] | Freq: Every day | INTRAMUSCULAR | Status: DC

## 2023-02-16 MED ORDER — LEVOTHYROXINE SODIUM 25 MCG PO TABS
25.0000 ug | ORAL_TABLET | Freq: Every day | ORAL | Status: DC
  Administered 2023-02-16 – 2023-02-24 (×9): 25 ug via ORAL
  Filled 2023-02-16 (×9): qty 1

## 2023-02-16 MED ORDER — DOCUSATE SODIUM 100 MG PO CAPS
100.0000 mg | ORAL_CAPSULE | Freq: Every day | ORAL | Status: DC
  Administered 2023-02-16 – 2023-02-17 (×2): 100 mg via ORAL
  Filled 2023-02-16 (×2): qty 1

## 2023-02-16 MED ORDER — INSULIN ASPART 100 UNIT/ML IJ SOLN
0.0000 [IU] | Freq: Three times a day (TID) | INTRAMUSCULAR | Status: DC
  Administered 2023-02-16: 2 [IU] via SUBCUTANEOUS
  Administered 2023-02-17: 1 [IU] via SUBCUTANEOUS

## 2023-02-16 MED ORDER — MUPIROCIN 2 % EX OINT: 1.0000 | TOPICAL_OINTMENT | Freq: Two times a day (BID) | CUTANEOUS | Status: DC

## 2023-02-16 MED ORDER — DILTIAZEM LOAD VIA INFUSION
10.0000 mg | Freq: Once | INTRAVENOUS | Status: AC
  Administered 2023-02-16: 10 mg via INTRAVENOUS
  Filled 2023-02-16: qty 10

## 2023-02-16 MED ORDER — OXYCODONE HCL 5 MG PO TABS
5.0000 mg | ORAL_TABLET | ORAL | Status: DC | PRN
  Administered 2023-02-16 (×2): 5 mg via ORAL
  Filled 2023-02-16 (×2): qty 1

## 2023-02-16 MED ORDER — LEVALBUTEROL HCL 0.63 MG/3ML IN NEBU: 0.6300 mg | INHALATION_SOLUTION | Freq: Four times a day (QID) | RESPIRATORY_TRACT | Status: DC | PRN

## 2023-02-16 MED ORDER — METOPROLOL SUCCINATE ER 25 MG PO TB24
25.0000 mg | ORAL_TABLET | Freq: Every day | ORAL | Status: DC
  Administered 2023-02-16 – 2023-02-23 (×7): 25 mg via ORAL
  Filled 2023-02-16 (×7): qty 1

## 2023-02-16 MED ORDER — DOCUSATE SODIUM 50 MG PO CAPS: 100.0000 mg | ORAL_CAPSULE | Freq: Every day | ORAL | Status: DC

## 2023-02-16 MED ORDER — CHLORHEXIDINE GLUCONATE CLOTH 2 % EX PADS
6.0000 | MEDICATED_PAD | Freq: Every day | CUTANEOUS | Status: DC
  Administered 2023-02-18 – 2023-02-24 (×7): 6 via TOPICAL

## 2023-02-16 MED ORDER — UMECLIDINIUM BROMIDE 62.5 MCG/ACT IN AEPB
1.0000 | INHALATION_SPRAY | Freq: Every day | RESPIRATORY_TRACT | Status: DC
  Administered 2023-02-16 – 2023-02-24 (×6): 1 via RESPIRATORY_TRACT
  Filled 2023-02-16: qty 7

## 2023-02-16 MED ORDER — HYDROMORPHONE HCL 1 MG/ML IJ SOLN
1.0000 mg | Freq: Once | INTRAMUSCULAR | Status: AC
  Administered 2023-02-16: 1 mg via INTRAVENOUS
  Filled 2023-02-16: qty 1

## 2023-02-16 MED ORDER — SODIUM ZIRCONIUM CYCLOSILICATE 10 G PO PACK
10.0000 g | PACK | Freq: Three times a day (TID) | ORAL | Status: AC
  Administered 2023-02-16 (×2): 10 g via ORAL
  Filled 2023-02-16 (×2): qty 1

## 2023-02-16 MED ORDER — HYDROCORTISONE 5 MG PO TABS
5.0000 mg | ORAL_TABLET | Freq: Two times a day (BID) | ORAL | Status: DC
  Administered 2023-02-16 (×2): 5 mg via ORAL
  Filled 2023-02-16 (×5): qty 1

## 2023-02-16 NOTE — Assessment & Plan Note (Signed)
-   We will continue Synthroid. 

## 2023-02-16 NOTE — TOC CAGE-AID Note (Signed)
Transition of Care [facility]) - CAGE-AID Screening   Patient Details  Name: Dail Marciano MRN: [id number] Date of Birth: 03/31/1941  [facility]) CM/SW Contact:    Leota Sauers, RN Phone Number: 02/16/2023, 8:15 PM   Clinical Narrative:  Patient denies use of alcohol or drugs, education not offered at this time.  CAGE-AID Screening:    Have You Ever Felt You Ought to Cut Down on Your Drinking or Drug Use?: No Have People Annoyed You By Critizing Your Drinking Or Drug Use?: No Have You Felt Bad Or Guilty About Your Drinking Or Drug Use?: No Have You Ever Had a Drink or Used Drugs First Thing In The Morning to Steady Your Nerves or to Get Rid of a Hangover?: No CAGE-AID Score: 0  Substance Abuse Education Offered: No

## 2023-02-16 NOTE — Assessment & Plan Note (Signed)
-  Continue Zetia and Lipitor 

## 2023-02-16 NOTE — Evaluation (Signed)
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  Clinical Impression  Pt admitted with above diagnosis. Lives at home alone, in a single-level home with a ramped entrance; Prior to admission, pt was independent, walking without an assistive device, driving (including to/from HD); Porceeded with PT eval as Dr. Ave Filter communicated OK to see pt for PT while keeping strict NWB (and PT order was imminenet dc);  Presents to PT with extreme pain RLE limiting activity tolerance, and any functional mobility; Very limited eval today because of RLE pain; Will return postop and revisit goals, plan of care, and dc planning;  Pt currently with functional limitations due to the deficits listed below (see PT Problem List). Pt will benefit from skilled PT to increase their independence and safety with mobility to allow discharge to the venue listed below.          Recommendations for follow up therapy are one component of a multi-disciplinary discharge planning process, led by the attending physician.  Recommendations may be updated based on patient status, additional functional criteria and insurance authorization.  Follow Up Recommendations       Assistance Recommended at Discharge Frequent or constant Supervision/Assistance  Patient can return home with the following  A lot of help with walking and/or transfers;A lot of help with bathing/dressing/bathroom;Assistance with cooking/housework;Assist for transportation    Equipment Recommendations Rolling walker (2 wheels);BSC/3in1;Wheelchair (measurements PT);Wheelchair cushion (measurements PT) (will be better discerned postop)  Recommendations for Other Services  OT consult  (Consider ordering postop)    Functional Status Assessment Patient has had a recent decline in their functional status and demonstrates the ability to make significant improvements in function in a reasonable and predictable amount of time.     Precautions / Restrictions Precautions Precautions: Fall Precaution Comments: extreme pain Required Braces or Orthoses: Splint/Cast Splint/Cast: R Lower leg Restrictions Weight Bearing Restrictions: Yes RLE Weight Bearing: Non weight bearing      Mobility  Bed Mobility Overal bed mobility: Needs Assistance             General bed mobility comments: Pain limiting ability to roll or move supine to sit; 2 person assist to slide up for better positioning in bed; Attempted bed to chair position, however pt did not tolerate    Transfers                   General transfer comment: Deferred    Ambulation/Gait                  Stairs            Wheelchair Mobility    Modified Rankin (Stroke Patients Only)       Balance                                             Pertinent Vitals/Pain Pain Assessment Pain Assessment: Faces Faces Pain Scale: Hurts worst Pain Location: RLE with any movement Pain Descriptors / Indicators: Constant, Crying, Grimacing, Guarding, Sharp Pain Intervention(s): Limited activity within patient's tolerance, Repositioned, Other (comment) (RN contacting MD to inquire about pain medication)  Home Living Family/patient expects to be discharged to:: Private residence Living Arrangements: Alone Available Help at Discharge: Family Type of Home: Mobile home Home Access: Ramped entrance       Home Layout: One level   Additional Comments: Will need more information re: bathroom setup    Prior Function Prior Level of Function : Independent/Modified Independent             Mobility Comments: No assistive device needed ADLs Comments: Drives herself to/from HD,  only recently started using the [place] service     Hand Dominance   Dominant Hand: Right    Extremity/Trunk Assessment   Upper Extremity Assessment Upper Extremity Assessment: Defer to OT evaluation RUE Deficits / Details: Pain at forearm and wrist, significant bruising dorsolateral aspect of R wrist; able to lift UE up against gravity and actively move fingers    Lower Extremity Assessment Lower Extremity Assessment: RLE deficits/detail RLE Deficits / Details: sensation intact to light touch at R toes, with + active toe flex/extend; Significant pain limiting all other movement of RLE; repositioned in elevation       Communication   Communication: No difficulties  Cognition Arousal/Alertness: Awake/alert Behavior During Therapy: WFL for tasks assessed/performed, Restless Overall Cognitive Status: Impaired/Different from baseline                                 General Comments: Significant pain limiting ability to interact        General Comments General comments (skin integrity, edema, etc.): Pt in exrteme pain RLE; initially on 1 L supplememental O2, increased to 2 L supplemental O2 (after discussing with Dwana Curd, RN) which is her baseline; HR variable throughout, rnaging 109 to 136 bpm; BP 96/53, MAP 67; based on these numbers, and pt's pain level, opted not to sit up further in bed to chair position    Exercises     Assessment/Plan    PT Assessment Patient needs continued PT services  PT Problem List Decreased strength;Decreased range of motion;Decreased activity tolerance;Decreased balance;Decreased mobility;Decreased knowledge of use of DME;Decreased coordination;Decreased safety awareness;Decreased knowledge of precautions;Cardiopulmonary status limiting activity;Pain       PT Treatment Interventions DME instruction;Gait training;Functional mobility training;Therapeutic activities;Therapeutic exercise;Balance training;Neuromuscular re-education;Cognitive  remediation;Patient/family education;Wheelchair mobility training    PT Goals (Current goals can be found in the Care Plan section)  Acute Rehab PT Goals Patient Stated Goal: decr pain PT Goal Formulation: Patient unable to participate in goal setting Time For Goal Achievement: 03/02/23 Potential to Achieve Goals: Fair    Frequency Min 4X/week     Co-evaluation               AM-PAC PT "6 Clicks" Mobility  Outcome Measure Help needed turning from your back to your side while in a flat bed without using bedrails?: Total Help needed moving from lying on your back to sitting on the side of a flat bed without using bedrails?: Total Help needed moving to and from a bed to a chair (including a wheelchair)?: Total Help needed standing up from a chair using your arms (e.g., wheelchair or bedside chair)?: Total Help needed to walk in hospital room?: Total Help needed climbing 3-5 steps with a railing? : Total 6 Click Score: 6    End of Session Equipment Utilized During Treatment: Oxygen Activity Tolerance: Patient limited by pain Patient left: in bed;with call bell/phone within reach;with nursing/sitter in room Nurse Communication: Mobility status;Patient requests  pain meds PT Visit Diagnosis: Pain;History of falling (Z91.81);Other abnormalities of gait and mobility (R26.89) Pain - Right/Left: Right Pain - part of body: Leg    Time: 1610-9604 PT Time Calculation (min) (ACUTE ONLY): 22 min   Charges:   PT Evaluation $PT Eval Moderate Complexity: 1 Mod          Van Clines, PT  Acute Rehabilitation Services Office [phone]   Kayla Vasquez 02/16/2023, 3:28 PM

## 2023-02-16 NOTE — Progress Notes (Signed)
TRH night cross cover note:   I was notified by RN that the patient is complaining of breakthrough discomfort relating to her right lower extremity and spite of existing pain orders that include oxycodone 5 mg p.o. every 4 hours as needed as well as Dilaudid 0.5 mg IV every 3 hours as needed, and that she is not yet due for her next dose of pain medication.  I subsequently placed order for Dilaudid 0.5 mg IV x 1 dose now.     Newton Pigg, DO Hospitalist

## 2023-02-16 NOTE — Consult Note (Addendum)
Renal Service Consult Note [facility]  Kayla Vasquez 02/16/2023 Kayla Krabbe, MD Requesting Physician: [name]  Reason for Consult: ESRD pt w/ ankle fracture HPI: The patient is a 82 y.o. year-old w/ PMH as below who presented to ED after tripping over steps and falling. In ED VSS 150/63, HR / RR stable. K+ 5.4 and creat 2.3. Xrays showed trimalleolar fracture of the R ankle. CXR was clear. Pt rec'd IV pain meds and zofran and was admitted to med-surg bed. We are asked to see for esrd.   Pt seen in room. In a fair amt of pain, no abd pain/ CP or n/v/d. Pt lives alone, gets a ride to dialysis through a service. Says she gets HD in [place], her [place] kidney doctor is [name]. Denies any SOB or CP.   ROS - denies CP, no joint pain, no HA, no blurry vision, no rash, no diarrhea, no nausea/ vomiting, no dysuria, no difficulty voiding   Past Medical History  Past Medical History:  Diagnosis Date   Cancer (HCC)    High cholesterol    Past Surgical History  Past Surgical History:  Procedure Laterality Date   BREAST SURGERY     Family History  Family History  Problem Relation Age of Onset   Diabetes Mellitus II Mother    Hypertension Mother    Social History  reports that she has never smoked. She has never used smokeless tobacco. She reports that she does not drink alcohol and does not use drugs. Allergies  Allergies  Allergen Reactions   Amoxicillin-Pot Clavulanate Rash   Codeine Nausea And Vomiting   Venlafaxine Other (See Comments)    Hallucination      Home medications Prior to Admission medications   Medication Sig Start Date End Date Taking? Authorizing Provider  albuterol (PROVENTIL HFA) 108 (90 Base) MCG/ACT inhaler Inhale 2 puffs into the lungs every 6 (six) hours as needed for wheezing. 05/16/17  Yes [provider]  albuterol (PROVENTIL) (2.5 MG/3ML) 0.083% nebulizer solution Take 2.5 mg by nebulization every 6 (six) hours as  needed for wheezing. 11/26/21  Yes [provider]  aspirin 325 MG tablet Take 325 mg by mouth every evening.   Yes [provider]  Biotin 300 MCG TABS Take 300 mcg by mouth at bedtime.   Yes [provider]  citalopram (CELEXA) 40 MG tablet Take 40 mg by mouth at bedtime. 06/10/16  Yes [provider]  Docusate Sodium (DSS) 100 MG CAPS Take 100 mg by mouth at bedtime. 10/29/22  Yes [provider]  ezetimibe (ZETIA) 10 MG tablet Take 10 mg by mouth at bedtime.   Yes [provider]  fluticasone furoate-vilanterol (BREO ELLIPTA) 100-25 MCG/INH AEPB Inhale 1 puff into the lungs daily. 07/17/18  Yes [provider]  hydrocortisone (CORTEF) 10 MG tablet Take 5 mg by mouth 2 (two) times daily.   Yes [provider]  ibuprofen (ADVIL,MOTRIN) 200 MG tablet Take 400 mg by mouth every 6 (six) hours as needed for mild pain.   Yes [provider]  levothyroxine (SYNTHROID) 25 MCG tablet Take 25 mcg by mouth daily before breakfast. 02/02/22  Yes [provider]  losartan (COZAAR) 25 MG tablet Take 25 mg by mouth every evening. 11/10/22  Yes [provider]  metoprolol succinate (TOPROL-XL) 25 MG 24 hr tablet Take 25 mg by mouth at bedtime. 06/10/22  Yes [provider]  Multiple Vitamin (MULTI-VITAMINS) TABS Take  1 tablet by mouth every evening.   Yes [provider]  NIFEdipine (PROCARDIA XL/NIFEDICAL XL) 60 MG 24 hr tablet Take 60 mg by mouth every evening. 06/22/22  Yes [provider]  omeprazole (PRILOSEC) 40 MG capsule Take 40 mg by mouth every evening.   Yes [provider]  simvastatin (ZOCOR) 40 MG tablet Take 40 mg by mouth every evening.    Yes [provider]  SPIRIVA HANDIHALER 18 MCG inhalation capsule Place 18 mcg into inhaler and inhale daily.   Yes [provider]     Vitals:   02/15/23 2148 02/15/23 2241 02/16/23 0740 02/16/23 1230  BP: (!) 143/69  (!) 152/63 107/78   Pulse: 69 77 84 (!) 120  Resp: 17 17 18    Temp:  97.6 F (36.4 C) 98 F (36.7 C) 98.3 F (36.8 C)  TempSrc:  Oral Oral Oral  SpO2: 100% 93% 96% 96%  Weight:      Height:       Exam Gen alert, elderly WF no distress No rash, cyanosis or gangrene Sclera anicteric, throat clear  No jvd or bruits Chest clear bilat to bases, no rales/ wheezing RRR no MRG Abd soft ntnd no mass or ascites +bs GU defer MS no joint effusions or deformity Ext no LE or UE edema, R lower leg is wrapped  Neuro is alert, Ox 3 , nf    L IJ TDC      Home meds include - asa, celexa, zetia, breo ellipta, cortef, advil, synthroid, losartan 25 hs, toprol xl 24 qd, mvi, procardia xl 60 hs, prilosec, zocor, spiriva, prns     OP HD: TTS HD in [place], [place] (Dr Leretha Dykes, [place])    [facility]  need records   Assessment/ Plan: Fall/ R ankle fracture - per ortho Afib w/ RVR - on diltiazem gtt, and po BB ESRD - on HD TTS. HD tomorrow. Using L chest TDC.  HTN/ volume - BP's low to normal, euvolemic on exam. Small UFG next hd. I dc'd her IVF"s at 100 cc/hr.  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 DM2 - per pmd COPD       [name]  MD [facility] 02/16/2023, 3:22 PM  Recent Labs  Lab 02/15/23 1804  HGB 12.2  CALCIUM 8.6*  CREATININE 2.36*  K 5.4*   Inpatient medications:  citalopram  40 mg Oral Daily   diltiazem  10 mg Intravenous Once   docusate sodium  100 mg Oral QHS   enoxaparin (LOVENOX) injection  30 mg Subcutaneous Q24H   ezetimibe  10 mg Oral Daily   fluticasone furoate-vilanterol  1 puff Inhalation Daily   hydrocortisone  5 mg Oral BID   insulin aspart  0-5 Units Subcutaneous QHS   insulin aspart  0-9 Units Subcutaneous TID WC   levothyroxine  25 mcg Oral Q0600   metoprolol succinate  25 mg Oral QHS   multivitamin with minerals  1 tablet Oral QPM   simvastatin  40 mg Oral QPM   umeclidinium bromide  1  puff Inhalation Daily    sodium chloride     diltiazem (CARDIZEM) infusion     acetaminophen **OR** acetaminophen, albuterol, HYDROmorphone (DILAUDID) injection, ondansetron **OR** ondansetron (ZOFRAN) IV, oxyCODONE, traZODone

## 2023-02-16 NOTE — [facility] Notes (Signed)
EKG results communicated with Dr. Rock Nephew in Afib/RVR. Order placed to transfer to [facility] and for cardizem drip. Report given to [facility], RN and pt subsequently transferred to [address]. Left unit in bed pushed by this writer and Jun, NT. Arrived in stable condition.

## 2023-02-16 NOTE — Assessment & Plan Note (Signed)
-   We will continue Cortef.

## 2023-02-16 NOTE — Assessment & Plan Note (Signed)
-   We will continue her inhalers and nebulized albuterol.

## 2023-02-16 NOTE — Consult Note (Signed)
Reason for Consult:Right ankle fx Referring Physician: Lynden Oxford Time called: 1029 Time at bedside: 1100   Kayla Vasquez is an 82 y.o. female.  HPI: Kayla Vasquez was getting on the bus after HD and couldn't quite get up the third step. She fell forwards and her back foot twisted. She had immediate pain and could not bear weight. She was brought to the ED where x-rays showed an ankle fx and orthopedic surgery was consulted. She lives at home and does not use any assistive devices to ambulate.  Past Medical History:  Diagnosis Date  . Cancer (HCC)   . High cholesterol     Past Surgical History:  Procedure Laterality Date  . BREAST SURGERY      Family History  Problem Relation Age of Onset  . Diabetes Mellitus II Mother   . Hypertension Mother     Social History:  reports that she has never smoked. She has never used smokeless tobacco. She reports that she does not drink alcohol and does not use drugs.  Allergies:  Allergies  Allergen Reactions  . Amoxicillin-Pot Clavulanate Rash  . Codeine Nausea And Vomiting  . Venlafaxine Other (See Comments)    Hallucination       Medications: I have reviewed the patient's current medications.  Results for orders placed or performed during the hospital encounter of 02/15/23 (from the past 48 hour(s))  CBC with Differential     Status: Abnormal   Collection Time: 02/15/23  6:04 PM  Result Value Ref Range   WBC 9.0 4.0 - 10.5 K/uL   RBC 3.98 3.87 - 5.11 MIL/uL   Hemoglobin 12.2 12.0 - 15.0 g/dL   HCT 16.1 09.6 - 04.5 %   MCV 104.3 (H) 80.0 - 100.0 fL   MCH 30.7 26.0 - 34.0 pg   MCHC 29.4 (L) 30.0 - 36.0 g/dL   RDW 40.9 (H) 81.1 - 91.4 %   Platelets 183 150 - 400 K/uL   nRBC 0.0 0.0 - 0.2 %   Neutrophils Relative % 70 %   Neutro Abs 6.3 1.7 - 7.7 K/uL   Lymphocytes Relative 17 %   Lymphs Abs 1.5 0.7 - 4.0 K/uL   Monocytes Relative 8 %   Monocytes Absolute 0.7 0.1 - 1.0 K/uL   Eosinophils Relative 3 %   Eosinophils Absolute 0.3  0.0 - 0.5 K/uL   Basophils Relative 1 %   Basophils Absolute 0.1 0.0 - 0.1 K/uL   Immature Granulocytes 1 %   Abs Immature Granulocytes 0.08 (H) 0.00 - 0.07 K/uL    Comment: Performed at [facility], [address] [place], [place] [address]  Basic metabolic panel     Status: Abnormal   Collection Time: 02/15/23  6:04 PM  Result Value Ref Range   Sodium 136 135 - 145 mmol/L   Potassium 5.4 (H) 3.5 - 5.1 mmol/L    Comment: HEMOLYSIS AT THIS LEVEL MAY AFFECT RESULT   Chloride 101 98 - 111 mmol/L   CO2 25 22 - 32 mmol/L   Glucose, Bld 81 70 - 99 mg/dL    Comment: Glucose reference range applies only to samples taken after fasting for at least 8 hours.   BUN 21 8 - 23 mg/dL   Creatinine, Ser 6.21 (H) 0.44 - 1.00 mg/dL   Calcium 8.6 (L) 8.9 - 10.3 mg/dL   GFR, Estimated 20 (L) >60 mL/min    Comment: (NOTE) Calculated using the CKD-EPI Creatinine Equation (2021)    Anion  gap 10 5 - 15    Comment: Performed at [facility], [address], [place], [place] [address]  Glucose, capillary     Status: Abnormal   Collection Time: 02/16/23  7:55 AM  Result Value Ref Range   Glucose-Capillary 111 (H) 70 - 99 mg/dL    Comment: Glucose reference range applies only to samples taken after fasting for at least 8 hours.    DG Pelvis 1-2 Views  Result Date: 02/15/2023 CLINICAL DATA:  Slipped and fell at the dialysis center.  Pain. EXAM: PELVIS - 1-2 VIEW COMPARISON:  None Available. FINDINGS: No fracture or bone lesion. Hip joints, SI joints and pubic symphysis are normally spaced and aligned. Soft tissues are unremarkable. IMPRESSION: Negative. Electronically Signed   By: Amie Portland M.D.   On: 02/15/2023 17:32   DG Ankle Complete Right  Result Date: 02/15/2023 CLINICAL DATA:  Larey Seat at the dialysis center.  Right ankle pain. EXAM: RIGHT ANKLE - COMPLETE 3+ VIEW COMPARISON:  None Available. FINDINGS: Oblique fracture of the distal fibula extending from the posterior metadiaphysis to  the anteromedial distal metaphysis. Posterior fracture component is mildly displaced, 3 mm, posteriorly, and foreshortened by a similar degree. No fracture comminution. There is a nondisplaced fracture across the base of the medial malleolus best appreciated on the lateral view. No fracture comminution. There is also a fracture of the posterior malleolus, superimposed on the distal fibular fracture on the lateral view, without significant displacement and without comminution. Ankle joint is normally spaced and aligned. There is surrounding soft tissue swelling. IMPRESSION: 1. Trimalleolar fracture as detailed above. Ankle joint normally aligned. Electronically Signed   By: Amie Portland M.D.   On: 02/15/2023 17:31   DG Humerus Left  Result Date: 02/15/2023 CLINICAL DATA:  Larey Seat at the dialysis center.  Left arm pain. EXAM: LEFT HUMERUS - 2+ VIEW COMPARISON:  None Available. FINDINGS: There is no evidence of fracture or other focal bone lesions. Soft tissues are unremarkable. IMPRESSION: Negative. Electronically Signed   By: Amie Portland M.D.   On: 02/15/2023 17:28   DG Wrist Complete Right  Result Date: 02/15/2023 CLINICAL DATA:  Larey Seat at the dialysis center.  Right wrist pain. EXAM: RIGHT WRIST - COMPLETE 3+ VIEW COMPARISON:  None Available. FINDINGS: No fracture.  No bone lesion. Joints are normally spaced and aligned. There is significant dorsal soft tissue swelling. IMPRESSION: No fracture or dislocation. Electronically Signed   By: Amie Portland M.D.   On: 02/15/2023 17:28   DG Chest 2 View  Result Date: 02/15/2023 CLINICAL DATA:  Larey Seat at the dialysis center. EXAM: CHEST - 2 VIEW COMPARISON:  12/30/2022. FINDINGS: Cardiac silhouette is normal in size. No mediastinal or hilar masses or evidence of adenopathy. Small left pleural effusion. Linear opacity in the left mid lung consistent with atelectasis. Remainder of the lungs is clear. No right pleural effusion. No pneumothorax. Total left internal  jugular central venous catheter is stable. Skeletal structures are demineralized, but intact. IMPRESSION: 1. No acute cardiopulmonary disease. 2. Small left pleural effusion. Electronically Signed   By: Amie Portland M.D.   On: 02/15/2023 17:26    Review of Systems  HENT:  Negative for ear discharge, ear pain, hearing loss and tinnitus.   Eyes:  Negative for photophobia and pain.  Respiratory:  Negative for cough and shortness of breath.   Cardiovascular:  Negative for chest pain.  Gastrointestinal:  Negative for abdominal pain, nausea and vomiting.  Genitourinary:  Negative for  dysuria, flank pain, frequency and urgency.  Musculoskeletal:  Positive for arthralgias (Right ankle). Negative for back pain, myalgias and neck pain.  Neurological:  Negative for dizziness and headaches.  Hematological:  Does not bruise/bleed easily.  Psychiatric/Behavioral:  The patient is not nervous/anxious.    Blood pressure 107/78, pulse 84, temperature 98 F (36.7 C), temperature source Oral, resp. rate 18, height 5\' 6"  (1.676 m), weight 92 kg, SpO2 96 %. Physical Exam Constitutional:      General: She is not in acute distress.    Appearance: She is well-developed. She is not diaphoretic.  HENT:     Head: Normocephalic and atraumatic.  Eyes:     General: No scleral icterus.       Right eye: No discharge.        Left eye: No discharge.     Conjunctiva/sclera: Conjunctivae normal.  Cardiovascular:     Rate and Rhythm: Normal rate and regular rhythm.  Pulmonary:     Effort: Pulmonary effort is normal. No respiratory distress.  Musculoskeletal:     Cervical back: Normal range of motion.     Comments: LLE No traumatic wounds, ecchymosis, or rash  Short leg splint in place  No knee effusion  Knee stable to varus/ valgus and anterior/posterior stress  Sens DPN, SPN, TN intact  Motor EHL 5/5  Toes perfused, No significant edema  Skin:    General: Skin is warm and dry.  Neurological:     Mental  Status: She is alert.  Psychiatric:        Mood and Affect: Mood normal.        Behavior: Behavior normal.    Assessment/Plan: Right ankle fx -- Plan ORIF tomorrow with Dr. Carola Frost. Please keep NPO after MN. Multiple medical problems including HLD and ESRD on HD -- per primary service    Freeman Caldron, PA-C Orthopedic Surgery [phone] 02/16/2023, 11:09 AM

## 2023-02-16 NOTE — Assessment & Plan Note (Signed)
-   We will continue her antihypertensives. 

## 2023-02-16 NOTE — Anesthesia Preprocedure Evaluation (Signed)
Anesthesia Evaluation  Patient identified by MRN, date of birth, ID band Patient awake    Reviewed: Allergy & Precautions, NPO status , Patient's Chart, lab work & pertinent test results  Airway Mallampati: II  TM Distance: >3 FB Neck ROM: Full    Dental no notable dental hx. (+) Missing, Dental Advisory Given, Poor Dentition,    Pulmonary COPD,  COPD inhaler   Pulmonary exam normal breath sounds clear to auscultation       Cardiovascular hypertension, Pt. on medications and Pt. on home beta blockers Normal cardiovascular exam Rhythm:Regular Rate:Normal     Neuro/Psych    GI/Hepatic ,GERD  ,,  Endo/Other  diabetes, Insulin DependentHypothyroidism    Renal/GU ESRF and DialysisRenal diseaseTTS Dialysis  Lab Results      Component                Value               Date                      CREATININE               2.36 (H)            02/15/2023                BUN                      21                  02/15/2023                NA                       136                 02/15/2023                K                        5.4 (H)             02/15/2023                CL                       101                 02/15/2023                CO2                      25                  02/15/2023                Musculoskeletal   Abdominal   Peds  Hematology Lab Results      Component                Value               Date                      WBC  9.0                 02/15/2023                HGB                      12.2                02/15/2023                HCT                      41.5                02/15/2023                MCV                      104.3 (H)           02/15/2023                PLT                      183                 02/15/2023              Anesthesia Other Findings Breast CA  All: veneflexine, codeine, amoxicillin clauv  Reproductive/Obstetrics                              Anesthesia Physical Anesthesia Plan  ASA: 3  Anesthesia Plan: Regional   Post-op Pain Management: Regional block*, Minimal or no pain anticipated, Precedex and Ofirmev IV (intra-op)*   Induction: Intravenous  PONV Risk Score and Plan: 3 and Treatment may vary due to age or medical condition and Propofol infusion  Airway Management Planned: Natural Airway and Nasal Cannula  Additional Equipment: None  Intra-op Plan:   Post-operative Plan:   Informed Consent: I have reviewed the patients History and Physical, chart, labs and discussed the procedure including the risks, benefits and alternatives for the proposed anesthesia with the patient or authorized representative who has indicated his/her understanding and acceptance.     Dental advisory given  Plan Discussed with: CRNA and Anesthesiologist  Anesthesia Plan Comments: (R Pop block w GA)        Anesthesia Quick Evaluation

## 2023-02-16 NOTE — Assessment & Plan Note (Addendum)
-   Nephrology consult to be obtained. - Dr. Glenna Fellows was notified about the patient.

## 2023-02-16 NOTE — Progress Notes (Signed)
[facility] Progress Note Patient: Kayla Vasquez WUJ:[id number] DOB: [birth year] DOA: 02/15/2023  DOS: the patient was seen and examined on 02/16/2023  Brief hospital course: PMH of ESRD on HD TTS, HLD, breast cancer, paroxysmal A-fib, GI bleed not on any anticoagulation, nonobstructive CAD, hypothyroidism, adrenal insufficiency, COPD presented to the hospital with complaints of mechanical fall. She was getting into the transport vehicle after her HD and missed a step and fell down injuring her right ankle, right wrist and left arm.  Found to have trimalleolar fracture.  Developed A-fib with RVR on 5/15 requiring Cardizem drip. Assessment and Plan: Trimalleolar right ankle fracture closed. After mechanical fall. Orthopedic consulted. Plan for ORIF tomorrow with Dr. Dorena Dew. N.p.o. after midnight. Pain control for now.  Preoperative cardiac evaluation No history of coronary revascularization/CVA within 5 years. Recent stress test in 2019 was unremarkable Able to climb flight of stair, participates in recreational activity,does household chores. Denies prior adverse event with anesthesia. Denies alcohol use, drug use. With this the patient is a low risk for adverse Cardiac outcome from surgery. Estimated Risk Probability for Perioperative Myocardial Infarction or Cardiac Arrest: 1.5 %  Chales Abrahams et al.) Recommend further work up with echocardiogram prior to surgery due to development of A-fib with RVR. Be watchful of hydration since the pt has history of ESRD. Monitor Ins and Out.  Paroxysmal A-fib with RVR. Known history of paroxysmal A-fib currently developing RVR secondary to pain issues. Initiated on Cardizem drip.  Hold nifedipine. Continue metoprolol. Limited echocardiogram to ensure no significant drop in EF. Not on anticoagulation due to prior history of GI bleed.  ESRD HD TTS. Nephrology consulted. Continue HD per schedule.  Anemia of chronic kidney disease as well as from  chronic GI bleed. H&H relatively stable. Will monitor for now.  Nonobstructive CAD. Stress test in 2019 was negative for any acute abnormality. Echocardiogram recently showed EF of 6065%. Last echocardiogram in March 2024 showed no wall motion abnormality or significant valvular normality. Monitor for now.  Hypothyroidism. Continue Synthroid.  Mood disorder. Continue Celexa.  Adrenal insufficiency. Will continue Cortef 5 mg twice daily. Recommend stress dose steroid perioperatively.  HTN. Holding losartan.  HLD. Continuing statin.  COPD. No evidence of exacerbation. Continue inhalers.  And nebulizer.  Obesity Body mass index is 32.74 kg/m.  Placing the pt at higher risk of poor outcomes.   Subjective: Pain is her primary complaint.  No nausea or vomiting.  No chest pain.  No abdominal pain.  No fever no chills.  No dizziness lightheadedness.  Physical Exam: General: in Mild distress, No Rash Cardiovascular: S1 and S2 Present, No Murmur Respiratory: Good respiratory effort, Bilateral Air entry present. No Crackles, No wheezes Abdomen: Bowel Sound present, No tenderness Extremities: No edema Neuro: Alert and oriented x3, no new focal deficit  Data Reviewed: I have Reviewed nursing notes, Vitals, and Lab results. Since last encounter, pertinent lab results CBC and BMP   . I have ordered test including CBC and BMP  . I have discussed pt's care plan and test results with orthopedics  . I have ordered imaging echocardiogram  .  Disposition: Status is: Inpatient Remains inpatient appropriate because: Need for further workup and surgery  enoxaparin (LOVENOX) injection 30 mg Start: 02/15/23 2200   Family Communication: No one at bedside Level of care: Progressive switch to progressive due to A-fib with RVR Vitals:   02/15/23 2148 02/15/23 2241 02/16/23 0740 02/16/23 1230  BP: (!) 143/69 (!) 152/63 107/78   Pulse: 69 77  84 (!) 120  Resp: 17 17 18    Temp:  97.6 F  (36.4 C) 98 F (36.7 C) 98.3 F (36.8 C)  TempSrc:  Oral Oral Oral  SpO2: 100% 93% 96% 96%  Weight:      Height:         Author: Lynden Oxford, MD 02/16/2023 3:39 PM  Please look on [email or web address] to find out who is on call.

## 2023-02-16 NOTE — Assessment & Plan Note (Signed)
-   This is secondary to her right trimalleolar ankle fracture. - She will be admitted to a medical-surgical bed. - Pain management will be provided. - Orthopedic consult will be obtained while she is here. - Dr. Ave Filter was notified about the patient.

## 2023-02-16 NOTE — Assessment & Plan Note (Signed)
-   The patient will be placed on supplemental coverage with Nov[employer]. 

## 2023-02-16 NOTE — Progress Notes (Signed)
PT Cancellation Note  Patient Details Name: Kortnei Basden MRN: [id number] DOB: 03-Oct-1941   Cancelled Treatment:    Reason Eval/Treat Not Completed: Patient not medically ready  PT orders received, chart reviewed;  At time of this note, Ortho Consult note is not yet in the chart; It will be optimal to know the plan for Orthopedic management prior to PT eval, and will await Ortho consult (and, if needed, we can do the PT eval before, observing strict NWB);   Will follow up mid-morning,   Van Clines, PT  Acute Rehabilitation Services Office [phone] Secure Chat welcomed   Levi Aland 02/16/2023, 7:35 AM

## 2023-02-16 NOTE — Hospital Course (Addendum)
PMH of ESRD on HD TTS, HLD, breast cancer, paroxysmal A-fib, GI bleed not on any anticoagulation, nonobstructive CAD, hypothyroidism, adrenal insufficiency, COPD presented to the hospital with complaints of mechanical fall. She was getting into the transport vehicle after her HD and missed a step and fell down injuring her right ankle, right wrist and left arm.  Found to have trimalleolar fracture.  Developed A-fib with RVR on 5/15 requiring Cardizem drip. Having significant issues with pain control.  Developing delirium most likely secondary to psychotropic medications as well as postanesthesia effect. Had issues with her HD catheter requiring exchange. Now medically stable.  Awaiting outpatient SNF arrangements.

## 2023-02-17 ENCOUNTER — Inpatient Hospital Stay (HOSPITAL_COMMUNITY): Payer: Medicare Other

## 2023-02-17 ENCOUNTER — Other Ambulatory Visit: Payer: Self-pay

## 2023-02-17 ENCOUNTER — Inpatient Hospital Stay (HOSPITAL_COMMUNITY): Payer: Medicare Other | Admitting: Anesthesiology

## 2023-02-17 ENCOUNTER — Encounter (HOSPITAL_COMMUNITY): Payer: Self-pay | Admitting: Family Medicine

## 2023-02-17 ENCOUNTER — Encounter (HOSPITAL_COMMUNITY)
Admission: EM | Disposition: A | Discharge status: Skilled Nursing Facility | Payer: Self-pay | Source: Ambulatory Visit | Attending: Internal Medicine

## 2023-02-17 DIAGNOSIS — I120 Hypertensive chronic kidney disease with stage 5 chronic kidney disease or end stage renal disease: Secondary | ICD-10-CM | POA: Diagnosis not present

## 2023-02-17 DIAGNOSIS — I4891 Unspecified atrial fibrillation: Secondary | ICD-10-CM

## 2023-02-17 DIAGNOSIS — E1122 Type 2 diabetes mellitus with diabetic chronic kidney disease: Secondary | ICD-10-CM

## 2023-02-17 DIAGNOSIS — Z794 Long term (current) use of insulin: Secondary | ICD-10-CM

## 2023-02-17 DIAGNOSIS — S82851A Displaced trimalleolar fracture of right lower leg, initial encounter for closed fracture: Secondary | ICD-10-CM

## 2023-02-17 DIAGNOSIS — Z992 Dependence on renal dialysis: Secondary | ICD-10-CM

## 2023-02-17 DIAGNOSIS — N186 End stage renal disease: Secondary | ICD-10-CM | POA: Diagnosis not present

## 2023-02-17 HISTORY — PX: ORIF ANKLE FRACTURE: SHX5408

## 2023-02-17 LAB — GLUCOSE, CAPILLARY
Glucose-Capillary: 142 mg/dL — ABNORMAL HIGH (ref 70–99)
Glucose-Capillary: 142 mg/dL — ABNORMAL HIGH (ref 70–99)

## 2023-02-17 LAB — BASIC METABOLIC PANEL
Anion gap: 14 (ref 5–15)
BUN: 48 mg/dL — ABNORMAL HIGH (ref 8–23)
CO2: 24 mmol/L (ref 22–32)
Calcium: 8.7 mg/dL — ABNORMAL LOW (ref 8.9–10.3)
Chloride: 94 mmol/L — ABNORMAL LOW (ref 98–111)
Creatinine, Ser: 5.07 mg/dL — ABNORMAL HIGH (ref 0.44–1.00)
GFR, Estimated: 8 mL/min — ABNORMAL LOW (ref >60)
Glucose, Bld: 155 mg/dL — ABNORMAL HIGH (ref 70–99)
Potassium: 5.3 mmol/L — ABNORMAL HIGH (ref 3.5–5.1)
Sodium: 132 mmol/L — ABNORMAL LOW (ref 135–145)

## 2023-02-17 LAB — ECHOCARDIOGRAM LIMITED
AV Mean grad: 7 mmHg
AV Peak grad: 12.6 mmHg
Ao pk vel: 1.78 m/s
Calc EF: 65.8 %
Height: 66 in
S' Lateral: 2.4 cm
Single Plane A2C EF: 68 %
Single Plane A4C EF: 66 %
Weight: 3245.17 oz

## 2023-02-17 LAB — CBC
HCT: 38.7 % (ref 36.0–46.0)
Hemoglobin: 11.7 g/dL — ABNORMAL LOW (ref 12.0–15.0)
MCH: 30.4 pg (ref 26.0–34.0)
MCHC: 30.2 g/dL (ref 30.0–36.0)
MCV: 100.5 fL — ABNORMAL HIGH (ref 80.0–100.0)
Platelets: 209 10*3/uL (ref 150–400)
RBC: 3.85 MIL/uL — ABNORMAL LOW (ref 3.87–5.11)
RDW: 17.8 % — ABNORMAL HIGH (ref 11.5–15.5)
WBC: 15.9 10*3/uL — ABNORMAL HIGH (ref 4.0–10.5)
nRBC: 0 % (ref 0.0–0.2)

## 2023-02-17 LAB — TYPE AND SCREEN
ABO/RH(D): O POS
Antibody Screen: NEGATIVE

## 2023-02-17 LAB — MAGNESIUM: Magnesium: 1.9 mg/dL (ref 1.7–2.4)

## 2023-02-17 SURGERY — OPEN REDUCTION INTERNAL FIXATION (ORIF) ANKLE FRACTURE
Anesthesia: Monitor Anesthesia Care | Site: Ankle | Laterality: Right

## 2023-02-17 MED ORDER — TRANEXAMIC ACID-NACL 1000-0.7 MG/100ML-% IV SOLN
1000.0000 mg | INTRAVENOUS | Status: AC
  Administered 2023-02-17: 1000 mg via INTRAVENOUS
  Filled 2023-02-17: qty 100

## 2023-02-17 MED ORDER — HYDROMORPHONE HCL 1 MG/ML IJ SOLN
1.0000 mg | INTRAMUSCULAR | Status: DC | PRN
  Administered 2023-02-18: 1 mg via INTRAVENOUS
  Filled 2023-02-17: qty 1

## 2023-02-17 MED ORDER — PROPOFOL 1000 MG/100ML IV EMUL
INTRAVENOUS | Status: AC
  Filled 2023-02-17: qty 100

## 2023-02-17 MED ORDER — ROPIVACAINE HCL 5 MG/ML IJ SOLN
INTRAMUSCULAR | Status: DC | PRN
  Administered 2023-02-17: 30 mL via PERINEURAL
  Administered 2023-02-17: 15 mL via PERINEURAL

## 2023-02-17 MED ORDER — PHENYLEPHRINE HCL-NACL 20-0.9 MG/250ML-% IV SOLN
INTRAVENOUS | Status: DC | PRN
  Administered 2023-02-17: 30 ug/min via INTRAVENOUS

## 2023-02-17 MED ORDER — FENTANYL CITRATE (PF) 100 MCG/2ML IJ SOLN: 25.0000 ug | INTRAMUSCULAR | Status: DC | PRN

## 2023-02-17 MED ORDER — ALBUMIN HUMAN 25 % IV SOLN
INTRAVENOUS | Status: AC
  Administered 2023-02-17: 25 g
  Filled 2023-02-17: qty 100

## 2023-02-17 MED ORDER — DEXAMETHASONE SODIUM PHOSPHATE 10 MG/ML IJ SOLN
INTRAMUSCULAR | Status: AC
  Filled 2023-02-17: qty 1

## 2023-02-17 MED ORDER — 0.9 % SODIUM CHLORIDE (POUR BTL) OPTIME
TOPICAL | Status: DC | PRN
  Administered 2023-02-17: 1000 mL

## 2023-02-17 MED ORDER — HYDROCORTISONE SOD SUC (PF) 100 MG IJ SOLR
50.0000 mg | Freq: Three times a day (TID) | INTRAMUSCULAR | Status: DC
  Administered 2023-02-17: 50 mg via INTRAVENOUS
  Filled 2023-02-17 (×2): qty 1

## 2023-02-17 MED ORDER — CLONIDINE HCL (ANALGESIA) 100 MCG/ML EP SOLN
EPIDURAL | Status: DC | PRN
  Administered 2023-02-17: 100 ug

## 2023-02-17 MED ORDER — PROPOFOL 500 MG/50ML IV EMUL
INTRAVENOUS | Status: DC | PRN
  Administered 2023-02-17: 50 ug/kg/min via INTRAVENOUS

## 2023-02-17 MED ORDER — HYDROCORTISONE SOD SUC (PF) 100 MG IJ SOLR: 50.0000 mg | Freq: Once | INTRAMUSCULAR | Status: DC

## 2023-02-17 MED ORDER — HEPARIN SODIUM (PORCINE) 1000 UNIT/ML IJ SOLN
INTRAMUSCULAR | Status: AC
  Filled 2023-02-17: qty 4

## 2023-02-17 MED ORDER — ONDANSETRON HCL 4 MG/2ML IJ SOLN: 4.0000 mg | Freq: Once | INTRAMUSCULAR | Status: DC | PRN

## 2023-02-17 MED ORDER — ALBUMIN HUMAN 5 % IV SOLN
INTRAVENOUS | Status: AC
  Filled 2023-02-17: qty 250

## 2023-02-17 MED ORDER — POVIDONE-IODINE 10 % EX SWAB
2.0000 | Freq: Once | CUTANEOUS | Status: AC
  Administered 2023-02-17: 2 via TOPICAL

## 2023-02-17 MED ORDER — ROCURONIUM BROMIDE 10 MG/ML (PF) SYRINGE
PREFILLED_SYRINGE | INTRAVENOUS | Status: AC
  Filled 2023-02-17: qty 10

## 2023-02-17 MED ORDER — HYDROCORTISONE 10 MG PO TABS
10.0000 mg | ORAL_TABLET | Freq: Two times a day (BID) | ORAL | Status: DC
  Filled 2023-02-17: qty 1

## 2023-02-17 MED ORDER — FENTANYL CITRATE (PF) 100 MCG/2ML IJ SOLN
INTRAMUSCULAR | Status: AC
  Administered 2023-02-17: 25 ug
  Filled 2023-02-17: qty 2

## 2023-02-17 MED ORDER — CHLORHEXIDINE GLUCONATE 0.12 % MT SOLN
OROMUCOSAL | Status: AC
  Administered 2023-02-17: 15 mL via OROMUCOSAL
  Filled 2023-02-17: qty 15

## 2023-02-17 MED ORDER — FENTANYL CITRATE (PF) 100 MCG/2ML IJ SOLN: 25.0000 ug | Freq: Once | INTRAMUSCULAR | Status: DC

## 2023-02-17 MED ORDER — CHLORHEXIDINE GLUCONATE 4 % EX SOLN
60.0000 mL | Freq: Once | CUTANEOUS | Status: AC
  Administered 2023-02-17: 4 via TOPICAL
  Filled 2023-02-17: qty 60

## 2023-02-17 MED ORDER — ACETAMINOPHEN 10 MG/ML IV SOLN: 1000.0000 mg | Freq: Once | INTRAVENOUS | Status: DC | PRN

## 2023-02-17 MED ORDER — CEFAZOLIN SODIUM-DEXTROSE 2-4 GM/100ML-% IV SOLN
2.0000 g | INTRAVENOUS | Status: AC
  Administered 2023-02-17: 2 g via INTRAVENOUS
  Filled 2023-02-17: qty 100

## 2023-02-17 MED ORDER — ALBUMIN HUMAN 5 % IV SOLN: 250.0000 mL | Freq: Once | INTRAVENOUS | Status: DC

## 2023-02-17 MED ORDER — CEFAZOLIN SODIUM-DEXTROSE 1-4 GM/50ML-% IV SOLN
1.0000 g | Freq: Once | INTRAVENOUS | Status: AC
  Administered 2023-02-17: 1 g via INTRAVENOUS
  Filled 2023-02-17: qty 50

## 2023-02-17 MED ORDER — LIDOCAINE 2% (20 MG/ML) 5 ML SYRINGE
INTRAMUSCULAR | Status: AC
  Filled 2023-02-17: qty 5

## 2023-02-17 MED ORDER — FENTANYL CITRATE (PF) 250 MCG/5ML IJ SOLN
INTRAMUSCULAR | Status: AC
  Filled 2023-02-17: qty 5

## 2023-02-17 MED ORDER — CHLORHEXIDINE GLUCONATE 0.12 % MT SOLN: 15.0000 mL | Freq: Once | OROMUCOSAL | Status: AC

## 2023-02-17 MED ORDER — ONDANSETRON HCL 4 MG/2ML IJ SOLN
INTRAMUSCULAR | Status: AC
  Filled 2023-02-17: qty 2

## 2023-02-17 MED ORDER — DILTIAZEM HCL ER COATED BEADS 120 MG PO CP24
120.0000 mg | ORAL_CAPSULE | Freq: Every day | ORAL | Status: DC
  Administered 2023-02-17 – 2023-02-24 (×8): 120 mg via ORAL
  Filled 2023-02-17 (×8): qty 1

## 2023-02-17 MED ORDER — SODIUM CHLORIDE 0.9 % IV SOLN: INTRAVENOUS | Status: DC

## 2023-02-17 MED ORDER — ORAL CARE MOUTH RINSE: 15.0000 mL | Freq: Once | OROMUCOSAL | Status: AC

## 2023-02-17 MED ORDER — CITALOPRAM HYDROBROMIDE 20 MG PO TABS
20.0000 mg | ORAL_TABLET | Freq: Every day | ORAL | Status: DC
  Administered 2023-02-18 – 2023-02-19 (×2): 20 mg via ORAL
  Filled 2023-02-17 (×2): qty 1

## 2023-02-17 MED ORDER — PROPOFOL 10 MG/ML IV BOLUS
INTRAVENOUS | Status: AC
  Filled 2023-02-17: qty 20

## 2023-02-17 SURGICAL SUPPLY — 72 items
BAG COUNTER SPONGE SURGICOUNT (BAG) ×1 IMPLANT
BANDAGE ESMARK 6X9 LF (GAUZE/BANDAGES/DRESSINGS) ×1 IMPLANT
BIT DRILL 2.4X140 LONG SOLID (BIT) IMPLANT
BNDG ELASTIC 4INX 5YD STR LF (GAUZE/BANDAGES/DRESSINGS) IMPLANT
BNDG ELASTIC 4X5.8 VLCR STR LF (GAUZE/BANDAGES/DRESSINGS) IMPLANT
BNDG ELASTIC 6INX 5YD STR LF (GAUZE/BANDAGES/DRESSINGS) IMPLANT
BNDG ELASTIC 6X5.8 VLCR STR LF (GAUZE/BANDAGES/DRESSINGS) IMPLANT
BNDG ESMARK 6X9 LF (GAUZE/BANDAGES/DRESSINGS)
BNDG GAUZE DERMACEA FLUFF 4 (GAUZE/BANDAGES/DRESSINGS) ×2 IMPLANT
BRUSH SCRUB EZ PLAIN DRY (MISCELLANEOUS) ×2 IMPLANT
COVER MAYO STAND STRL (DRAPES) ×1 IMPLANT
COVER SURGICAL LIGHT HANDLE (MISCELLANEOUS) ×1 IMPLANT
CUFF TOURN SGL QUICK 34 (TOURNIQUET CUFF)
CUFF TRNQT CYL 34X4.125X (TOURNIQUET CUFF) ×1 IMPLANT
DRAPE C-ARM 42X72 X-RAY (DRAPES) ×1 IMPLANT
DRAPE C-ARMOR (DRAPES) ×1 IMPLANT
DRAPE HALF SHEET 40X57 (DRAPES) ×1 IMPLANT
DRAPE U-SHAPE 47X51 STRL (DRAPES) ×1 IMPLANT
DRSG EMULSION OIL 3X3 NADH (GAUZE/BANDAGES/DRESSINGS) IMPLANT
DRSG MEPITEL 4X7.2 (GAUZE/BANDAGES/DRESSINGS) IMPLANT
ELECT REM PT RETURN 9FT ADLT (ELECTROSURGICAL) ×1
ELECTRODE REM PT RTRN 9FT ADLT (ELECTROSURGICAL) ×1 IMPLANT
GAUZE PAD ABD 8X10 STRL (GAUZE/BANDAGES/DRESSINGS) IMPLANT
GAUZE SPONGE 4X4 12PLY STRL (GAUZE/BANDAGES/DRESSINGS) ×2 IMPLANT
GLOVE BIO SURGEON STRL SZ7.5 (GLOVE) ×1 IMPLANT
GLOVE BIO SURGEON STRL SZ8 (GLOVE) ×1 IMPLANT
GLOVE BIOGEL PI IND STRL 7.5 (GLOVE) ×1 IMPLANT
GLOVE BIOGEL PI IND STRL 8 (GLOVE) ×1 IMPLANT
GLOVE SURG ORTHO LTX SZ7.5 (GLOVE) ×2 IMPLANT
GOWN STRL REUS W/ TWL LRG LVL3 (GOWN DISPOSABLE) ×2 IMPLANT
GOWN STRL REUS W/ TWL XL LVL3 (GOWN DISPOSABLE) ×1 IMPLANT
GOWN STRL REUS W/TWL LRG LVL3 (GOWN DISPOSABLE) ×2
GOWN STRL REUS W/TWL XL LVL3 (GOWN DISPOSABLE) ×1
K-WIRE SNGL END 1.2X150 (MISCELLANEOUS) ×2
KIT BASIN OR (CUSTOM PROCEDURE TRAY) ×1 IMPLANT
KIT TURNOVER KIT B (KITS) ×1 IMPLANT
KWIRE SNGL END 1.2X150 (MISCELLANEOUS) IMPLANT
NDL HYPO 21X1.5 SAFETY (NEEDLE) IMPLANT
NEEDLE HYPO 21X1.5 SAFETY (NEEDLE) IMPLANT
NS IRRIG 1000ML POUR BTL (IV SOLUTION) ×1 IMPLANT
PACK GENERAL/GYN (CUSTOM PROCEDURE TRAY) ×1 IMPLANT
PACK ORTHO EXTREMITY (CUSTOM PROCEDURE TRAY) ×1 IMPLANT
PAD ARMBOARD 7.5X6 YLW CONV (MISCELLANEOUS) ×2 IMPLANT
PAD CAST 4YDX4 CTTN HI CHSV (CAST SUPPLIES) IMPLANT
PADDING CAST COTTON 4X4 STRL (CAST SUPPLIES) ×1
PADDING CAST COTTON 6X4 STRL (CAST SUPPLIES) IMPLANT
PLATE FIB 11H ANATOMICAL RT (Plate) IMPLANT
SCREW CANN HDLS LT 4X36 (Screw) IMPLANT
SCREW CANN PT MM SHT 4X36 (Screw) IMPLANT
SCREW CANN.SHORT HEAD 4.0X32MM (Screw) IMPLANT
SCREW LOCK 3 3.5X18 (Screw) IMPLANT
SCREW LOCK PLATE R3 3.5X12 (Screw) IMPLANT
SCREW LOCK PLATE R3 3.5X14 (Screw) IMPLANT
SCREW LOCK PLATE R3 3.5X16 (Screw) IMPLANT
SCREW NON LOCK 3.5X18 (Screw) IMPLANT
SCREW NON LOCKING 3.5X12 (Screw) IMPLANT
SPLINT PLASTER CAST FAST 5X30 (CAST SUPPLIES) IMPLANT
SPONGE T-LAP 18X18 ~~LOC~~+RFID (SPONGE) ×1 IMPLANT
STAPLER VISISTAT 35W (STAPLE) IMPLANT
SUCTION FRAZIER HANDLE 10FR (MISCELLANEOUS) ×1
SUCTION TUBE FRAZIER 10FR DISP (MISCELLANEOUS) ×1 IMPLANT
SUT ETHILON 2 0 FS 18 (SUTURE) ×2 IMPLANT
SUT PDS AB 2-0 CT1 27 (SUTURE) IMPLANT
SUT VIC AB 0 CT1 27 (SUTURE) ×1
SUT VIC AB 0 CT1 27XBRD ANBCTR (SUTURE) IMPLANT
SUT VIC AB 2-0 CT1 27 (SUTURE) ×2
SUT VIC AB 2-0 CT1 TAPERPNT 27 (SUTURE) ×2 IMPLANT
TOWEL GREEN STERILE (TOWEL DISPOSABLE) ×2 IMPLANT
TOWEL GREEN STERILE FF (TOWEL DISPOSABLE) ×1 IMPLANT
TUBE CONNECTING 12X1/4 (SUCTIONS) ×1 IMPLANT
UNDERPAD 30X36 HEAVY ABSORB (UNDERPADS AND DIAPERS) ×1 IMPLANT
WATER STERILE IRR 1000ML POUR (IV SOLUTION) ×1 IMPLANT

## 2023-02-17 NOTE — Progress Notes (Signed)
Pt's BP is low, 69/59 up to 82/42. Dr Richardson Landry notified, came in and seen pt. Pt is A&O x4. Cardizem drip decreased to 2.5mg /hr.

## 2023-02-17 NOTE — Progress Notes (Signed)
  Echocardiogram 2D Echocardiogram has been performed.  Kayla Vasquez 02/17/2023, 7:49 AM

## 2023-02-17 NOTE — Anesthesia Procedure Notes (Signed)
Anesthesia Regional Block: Popliteal block   Pre-Anesthetic Checklist: , timeout performed,  Correct Patient, Correct Site, Correct Laterality,  Correct Procedure, Correct Position, site marked,  Risks and benefits discussed,  Pre-op evaluation,  At surgeon's request and post-op pain management  Laterality: Lower and Right  Prep: Maximum Sterile Barrier Precautions used, chloraprep       Needles:  Injection technique: Single-shot  Needle Type: Echogenic Needle     Needle Length: 9cm  Needle Gauge: 21     Additional Needles:   Procedures:,,,, ultrasound used (permanent image in chart),,    Narrative:  Start time: 02/17/2023 9:37 AM End time: 02/17/2023 9:43 AM Injection made incrementally with aspirations every 5 mL.  Performed by: Personally  Anesthesiologist: Trevor Iha, MD  Additional Notes: Block assessed. Patient tolerated procedure well.

## 2023-02-17 NOTE — Progress Notes (Addendum)
No changes overnight.  Have coordinated with Dr. Alben Deeds the plan for anesthesia with hope to do entire case under regional block.  The risks and benefits of surgery for right ankle repair were discussed with the patient, including the possibility of infection, nerve injury, vessel injury, wound breakdown, arthritis, symptomatic hardware, DVT/ PE, loss of motion, malunion, nonunion, and need for further surgery among others. These risks were acknowledged and consent provided to proceed.  Myrene Galas, MD [facility], [facility] [phone]

## 2023-02-17 NOTE — Op Note (Signed)
02/17/2023  1:01 PM  PATIENT:  Kayla Vasquez  [birth year] female   MEDICAL RECORD NUMBER: [id number]  PRE-OPERATIVE DIAGNOSIS:  Right Trimalleolar Ankle Fracture  POST-OPERATIVE DIAGNOSIS:   1. Right Trimalleolar Ankle Fracture 2. Stable syndesmosis  PROCEDURE:   OPEN REDUCTION INTERNAL FIXATION OF RIGHT TRIMALLEOLAR ANKLE FRACTURE WITHOUT FIXATION OF THE POSTERIOR LIP MANUAL APPLICATION OF STRESS ANKLE SYNDESMOSIS UNDER FLUOROSCOPY  SURGEON:  Doralee Albino. Carola Frost, M.D.  ASSISTANT:  Montez Morita, PA-C.  ANESTHESIA:  Regional.  COMPLICATIONS:  None.  TOURNIQUET: None.  ESTIMATED BLOOD LOSS:  50 mL.  DISPOSITION:  To PACU.  CONDITION:  Stable.  DELAY START OF DVT PROPHYLAXIS BECAUSE OF BLEEDING RISK: NO  BRIEF SUMMARY AND INDICATIONS FOR PROCEDURE:  The patient is a 82 y.o. who sustained a fracture of the ankle, treated with reduction at the time of presentation to the Emergency Department, followed by splint application. Patient subsequently seen in the office to reassess soft tissues, which now show sufficient swelling resolution to allow for surgical repair.  I discussed with the patient the risks and benefits of surgery including the possibility of infection, DVT, PE, nerve injury, vessel injury, loss of motion, arthritis, symptomatic hardware, heart attack, stroke and need for further surgery, among others. We specifically discussed syndesmotic repair and that some of the implants used for this would likely require subsequent removal. After acknowledging these risks, consent was provided to proceed.  SUMMARY OF PROCEDURE:  The patient was taken to the operating room after administration of a regional block and preoperative antibiotics.  The right lower extremity was prepped and draped in the usual sterile fashion.  A timeout was held, and then an incision was made directly over the lateral malleolus with careful dissection to avoid injury to the superficial peroneal nerve.  The  periosteum was left intact as we continued deep dissection.  The fracture site was identified and curettage and lavage used to remove hematoma.  With the assistance of distal manipulation and placement of tenaculums, we were able to obtain an anatomic reduction with interdigitation of the primary fracture fragments.  Because of the angle and comminution of the fracture site, I did not place a lag screw but was able to hold this interdigitated and compressed with a clamp during application of a lateral malleolus plate.  We used the Paragon system and placed standard fixation initially and then locked fixation in the shaft and distal lateral malleolus, confirming plate position with x-ray and then continuing with a standard fixation as well as a locked fixation.  Final images showed appropriate reductional replacement, trajectory, and length.  Next, attention was turned to the medial side.  Percutaneous incisions were made to allow for distal placement of screws. I placed two k-wires, checked their position on orthogonal views, and followed with placement of 2 partially threaded screws at 36 mm and 32 mm.  Compression was obtained.  The C-arm was then brought back in and AP, lateral and mortise views showed restoration of ankle alignment reduction.    The posterior malleolar fracture fragment was then evaluated to gauge whether it should be fixed if it constituted a significant portion of the articular surface or whether, as an attachment site of the syndesmotic ligaments, its disruption and displacement contributed to instability. Based on these factors it did not require fixation.    I then performed an external rotation stress view of the ankle under live fluoroscopy.  No syndesmotic widening nor widening of the medial clear space was identified to suggest  a syndesmotic injury.  Wounds were irrigated once more and then closed in standard layered fashion using 2-0 Vicryl and 3-0 nylon.  A sterile gently  compressive dressing was applied, and then a posterior and stirrup splint with the ankle extended just above neutral. Montez Morita, PA-C, assisted me with all portions of the procedure, including obtaining reduction, instrumentation, closure, and splint application.  PROGNOSIS: The patient will be nonweightbearing in the splint with ice and elevation.  We will plan to see her back in the office in 3 weeks for splint and suture removal. Sooner if she has any concerns. Later transition to a Cam boot with unrestricted range of motion of the  ankle at that time.  Weightbearing at 6 weeks.

## 2023-02-17 NOTE — Progress Notes (Signed)
Ok to reduce celexa to the max dose of 20mg /day due to the risk of qtc prolongation in age>60 per Dr. Allena Katz.  Ulyses Southward, PharmD, BCIDP, AAHIVP, CPP Infectious Disease Pharmacist 02/17/2023 9:15 AM

## 2023-02-17 NOTE — Progress Notes (Signed)
[facility] Progress Note Patient: Kayla Vasquez ZOX:[id number] DOB: 11/06/1940 DOA: 02/15/2023  DOS: the patient was seen and examined on 02/17/2023  Brief hospital course: PMH of ESRD on HD TTS, HLD, breast cancer, paroxysmal A-fib, GI bleed not on any anticoagulation, nonobstructive CAD, hypothyroidism, adrenal insufficiency, COPD presented to the hospital with complaints of mechanical fall. She was getting into the transport vehicle after her HD and missed a step and fell down injuring her right ankle, right wrist and left arm.  Found to have trimalleolar fracture.  Developed A-fib with RVR on 5/15 requiring Cardizem drip. Assessment and Plan: Trimalleolar right ankle fracture closed. After mechanical fall. Orthopedic consulted. Plan for ORIF today with Dr. Carola Frost. N.p.o. now. Pain control for now. Postop DVT Px, Weight bearing status, wound management and pain control per Orthopedics.  Preoperative cardiac evaluation No history of coronary revascularization/CVA within 5 years. Recent stress test in 2019 was unremarkable Able to climb flight of stair, participates in recreational activity,does household chores. Denies prior adverse event with anesthesia. Denies alcohol use, drug use. With this the patient is a low risk for adverse Cardiac outcome from surgery. Estimated Risk Probability for Perioperative Myocardial Infarction or Cardiac Arrest: 1.5 %  Chales Abrahams et al.) Echocardiogram shows preserved EF and no WMA.  No prohibitive risk for surgery  Be watchful of hydration since the pt has history of ESRD. Monitor Ins and Out.  Paroxysmal A-fib with RVR. Known history of paroxysmal A-fib currently developing RVR secondary to pain issues. Initiated on Cardizem drip.  Hold nifedipine. Continue drip periop and post op we will switch to PO meds Continue metoprolol. Limited echocardiogram reassuring with normal EF Not on anticoagulation due to prior history of GI bleed.  ESRD HD  TTS. Nephrology consulted. Continue HD per schedule.  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 H&H relatively stable. Will monitor for now.  Nonobstructive CAD. Stress test in 2019 was negative for any acute abnormality. Echocardiogram recently showed EF of 60-65%. Repeated this admission and EF remains same Echocardiogram showed no wall motion abnormality or significant valvular abnormality. Monitor for now.  Hypothyroidism. Continue Synthroid.  Mood disorder. Continue Celexa.  Adrenal insufficiency. Will continue Cortef 5 mg twice daily. Recommend stress dose steroid perioperatively.  HTN. Holding losartan.  HLD. Continuing statin.  COPD. No evidence of exacerbation. Continue inhalers.  And nebulizer.  Obesity Body mass index is 32.74 kg/m.  Placing the pt at higher risk of poor outcomes.   Stage 1 pressure injury left buttocks  POA Continue foam dressing.   Subjective: pain is uncontrolled, no chest pain no abdominal pain. No fever or chills. Had RVR yesterday which is now controlled.   Physical Exam: General: in moderate distress, No Rash Cardiovascular: S1 and S2 Present, No Murmur Respiratory: Good respiratory effort, Bilateral Air entry present. No Crackles, No wheezes Abdomen: Bowel Sound present, No tenderness Extremities: trace right leg edema Neuro: Alert, no new focal deficit   Data Reviewed: I have Reviewed nursing notes, Vitals, and Lab results. Since last encounter, pertinent lab results CBC BMP`   . I have ordered test including CBC BMP  .   Disposition: Status is: Inpatient Remains inpatient appropriate because: Need for surgery and monitor for post op recovery   Family Communication: No one at bedside Level of care: Progressive continue due to RVR Vitals:   02/17/23 0343 02/17/23 0411 02/17/23 0500 02/17/23 0557  BP: 91/64 (!) 94/58 (!) 101/59 (!) 112/57  Pulse: 66 (!) 103  99 70  Resp: 18 19 16 19   Temp:   98.2 F (36.8 C)    TempSrc:  Oral    SpO2: 96% 97% 98% 96%  Weight:      Height:         Author: Lynden Oxford, MD 02/17/2023 8:29 AM  Please look on [email or web address] to find out who is on call.

## 2023-02-17 NOTE — Progress Notes (Signed)
Rx consulted for post ORIF prophylaxis dosing for cefazolin. Since he is ESRD, he will only need one dose this PM post HD.   Ancef 1g IV x1 post HD tonight.  Rx signs off  Ulyses Southward, PharmD, [place], AAHIVP, CPP Infectious Disease Pharmacist 02/17/2023 2:39 PM

## 2023-02-17 NOTE — Anesthesia Procedure Notes (Signed)
Anesthesia Regional Block: Adductor canal block   Pre-Anesthetic Checklist: , timeout performed,  Correct Patient, Correct Site, Correct Laterality,  Correct Procedure, Correct Position, site marked,  Risks and benefits discussed,  Surgical consent,  Pre-op evaluation,  At surgeon's request and post-op pain management  Laterality: Lower and Right  Prep: chloraprep       Needles:  Injection technique: Single-shot  Needle Type: Echogenic Needle     Needle Length: 9cm  Needle Gauge: 22     Additional Needles:   Procedures:,,,, ultrasound used (permanent image in chart),,    Narrative:  Start time: 02/17/2023 9:44 AM End time: 02/17/2023 9:50 AM Injection made incrementally with aspirations every 5 mL.  Performed by: Personally  Anesthesiologist: Trevor Iha, MD  Additional Notes: Block assessed prior to surgery. Pt tolerated procedure well.

## 2023-02-17 NOTE — Progress Notes (Signed)
[facility] Progress Note   Subjective:    Seen and examined patient on HD. S/p ORIF for R ankle fracture today. UFG set 2L. Noted SBPs in 100s.   Objective Vitals:   02/17/23 1600 02/17/23 1618 02/17/23 1630 02/17/23 1700  BP: (!) 100/52 (!) 147/120 110/60 (!) 103/46  Pulse: 86 (!) 52 (!) 27 95  Resp: (!) 22 20 (!) 23 (!) 21  Temp:      TempSrc:      SpO2: 98% 97% 94% 100%  Weight:      Height:       Physical Exam General: Awake, alert, NAD Heart:S1 and S2; No MRGs Lungs: Clear anteriorly and laterally; No wheezing, rales, or rhonchi Abdomen: Soft and non-tender Extremities: No edema LLE; R lower leg is wrapped Dialysis Access: L IJ [facility]   Filed Weights   02/15/23 1616  Weight: 92 kg    Intake/Output Summary (Last 24 hours) at 02/17/2023 1719 Last data filed at 02/17/2023 1235 Gross per 24 hour  Intake 400 ml  Output 25 ml  Net 375 ml    Additional Objective Labs: Basic Metabolic Panel: Recent Labs  Lab 02/15/23 1804 02/17/23 0210  NA 136 132*  K 5.4* 5.3*  CL 101 94*  CO2 25 24  GLUCOSE 81 155*  BUN 21 48*  CREATININE 2.36* 5.07*  CALCIUM 8.6* 8.7*   Liver Function Tests: No results for input(s): "AST", "ALT", "ALKPHOS", "BILITOT", "PROT", "ALBUMIN" in the last 168 hours. No results for input(s): "LIPASE", "AMYLASE" in the last 168 hours. CBC: Recent Labs  Lab 02/15/23 1804 02/17/23 0210  WBC 9.0 15.9*  NEUTROABS 6.3  --   HGB 12.2 11.7*  HCT 41.5 38.7  MCV 104.3* 100.5*  PLT 183 209   Blood Culture No results found for: "SDES", "SPECREQUEST", "CULT", "REPTSTATUS"  Cardiac Enzymes: No results for input(s): "CKTOTAL", "CKMB", "CKMBINDEX", "TROPONINI" in the last 168 hours. CBG: Recent Labs  Lab 02/16/23 0755 02/16/23 1141 02/16/23 1612 02/16/23 2138 02/17/23 0608  GLUCAP 111* 127* 199* 146* 142*   Iron Studies: No results for input(s): "IRON", "TIBC", "TRANSFERRIN", "FERRITIN" in the last 72 hours. No results found  for: "INR", "PROTIME" Studies/Results: DG Ankle Complete Right  Result Date: 02/17/2023 CLINICAL DATA:  [id number] Elective surgery [id number] EXAM: RIGHT ANKLE - COMPLETE 3+ VIEW COMPARISON:  Radiograph 02/15/2023 FINDINGS: Intraoperative images during medial and lateral malleolar ORIF. Improved fracture alignment. No evidence of immediate hardware complication. IMPRESSION: Intraoperative images during ankle fracture ORIF. No evidence of immediate hardware complication. Improved fracture alignment. Electronically Signed   By: Caprice Renshaw M.D.   On: 02/17/2023 12:25   DG C-Arm 1-60 Min-No Report  Result Date: 02/17/2023 Fluoroscopy was utilized by the requesting physician.  No radiographic interpretation.   DG C-Arm 1-60 Min-No Report  Result Date: 02/17/2023 Fluoroscopy was utilized by the requesting physician.  No radiographic interpretation.   ECHOCARDIOGRAM LIMITED  Result Date: 02/17/2023    ECHOCARDIOGRAM LIMITED REPORT   Patient Name:   Kayla Vasquez Date of Exam: 02/17/2023 Medical Rec #:  [id number]    Height:       66.0 in Accession #:    [id number]   Weight:       202.8 lb Date of Birth:  1941/07/22    BSA:          2.012 m Patient Age:    82 years     BP:           112/57 mmHg Patient  Gender: F            HR:           0 bpm. Exam Location:  Inpatient Procedure: Limited Echo, Cardiac Doppler and Color Doppler Indications:    I48.91* Unspeicified atrial fibrillation  History:        Patient has no prior history of Echocardiogram examinations.                 COPD; Risk Factors:Hypertension, Diabetes and Dyslipidemia.                 ESRD.  Sonographer:    Sheralyn Boatman RDCS Referring Phys: [id number] [facility] M PATEL  Sonographer Comments: Technically difficult study due to poor echo windows. Patient screaming with leg pain. Pre-op study. Patient with broken leg IMPRESSIONS  1. Left ventricular ejection fraction, by estimation, is 60 to 65%. The left ventricle has normal function. The left ventricle has no  regional wall motion abnormalities. There is moderate concentric left ventricular hypertrophy. Left ventricular diastolic function could not be evaluated.  2. A small pericardial effusion is present. The pericardial effusion is circumferential and posterior to the left ventricle.  3. The mitral valve is degenerative. Moderate mitral annular calcification.  4. The aortic valve is calcified. Aortic valve sclerosis/calcification is present, without any evidence of aortic stenosis. Aortic valve mean gradient measures 7.0 mmHg. Aortic valve Vmax measures 1.78 m/s.  5. There is normal pulmonary artery systolic pressure. The estimated right ventricular systolic pressure is 29.4 mmHg.  6. The inferior vena cava is normal in size with greater than 50% respiratory variability, suggesting right atrial pressure of 3 mmHg. FINDINGS  Left Ventricle: Left ventricular ejection fraction, by estimation, is 60 to 65%. The left ventricle has normal function. The left ventricle has no regional wall motion abnormalities. There is moderate concentric left ventricular hypertrophy. Left ventricular diastolic function could not be evaluated. Left ventricular diastolic function could not be evaluated due to atrial fibrillation. Right Ventricle: There is normal pulmonary artery systolic pressure. The tricuspid regurgitant velocity is 2.57 m/s, and with an assumed right atrial pressure of 3 mmHg, the estimated right ventricular systolic pressure is 29.4 mmHg. Pericardium: A small pericardial effusion is present. The pericardial effusion is circumferential and posterior to the left ventricle. Mitral Valve: The mitral valve is degenerative in appearance. There is mild thickening of the mitral valve leaflet(s). There is mild calcification of the mitral valve leaflet(s). Moderate mitral annular calcification. MV peak gradient, 14.4 mmHg. The mean mitral valve gradient is 4.0 mmHg. Tricuspid Valve: Tricuspid valve regurgitation is mild. Aortic  Valve: The aortic valve is calcified. Aortic valve sclerosis/calcification is present, without any evidence of aortic stenosis. Aortic valve mean gradient measures 7.0 mmHg. Aortic valve peak gradient measures 12.6 mmHg. Venous: The inferior vena cava is normal in size with greater than 50% respiratory variability, suggesting right atrial pressure of 3 mmHg. Additional Comments: Spectral Doppler performed. Color Doppler performed.  LEFT VENTRICLE PLAX 2D LVIDd:         3.50 cm LVIDs:         2.40 cm LV PW:         1.70 cm LV IVS:        1.10 cm LVOT diam:     2.10 cm LVOT Area:     3.46 cm  LV Volumes (MOD) LV vol d, MOD A2C: 46.2 ml LV vol d, MOD A4C: 56.1 ml LV vol s, MOD A2C: 14.8 ml LV vol  s, MOD A4C: 19.1 ml LV SV MOD A2C:     31.4 ml LV SV MOD A4C:     56.1 ml LV SV MOD BP:      34.7 ml IVC IVC diam: 1.90 cm AORTIC VALVE AV Vmax:      177.50 cm/s AV Vmean:     123.000 cm/s AV VTI:       0.330 m AV Peak Grad: 12.6 mmHg AV Mean Grad: 7.0 mmHg  AORTA Ao Asc diam: 3.20 cm MITRAL VALVE            TRICUSPID VALVE MV Peak grad: 14.4 mmHg TR Peak grad:   26.4 mmHg MV Mean grad: 4.0 mmHg  TR Vmax:        257.00 cm/s MV Vmax:      1.90 m/s MV Vmean:     93.7 cm/s SHUNTS                         Systemic Diam: 2.10 cm Armanda Magic MD Electronically signed by Armanda Magic MD Signature Date/Time: 02/17/2023/8:27:42 AM    Final    DG Pelvis 1-2 Views  Result Date: 02/15/2023 CLINICAL DATA:  Slipped and fell at the dialysis center.  Pain. EXAM: PELVIS - 1-2 VIEW COMPARISON:  None Available. FINDINGS: No fracture or bone lesion. Hip joints, SI joints and pubic symphysis are normally spaced and aligned. Soft tissues are unremarkable. IMPRESSION: Negative. Electronically Signed   By: Amie Portland M.D.   On: 02/15/2023 17:32   DG Ankle Complete Right  Result Date: 02/15/2023 CLINICAL DATA:  Larey Seat at the dialysis center.  Right ankle pain. EXAM: RIGHT ANKLE - COMPLETE 3+ VIEW COMPARISON:  None Available. FINDINGS: Oblique  fracture of the distal fibula extending from the posterior metadiaphysis to the anteromedial distal metaphysis. Posterior fracture component is mildly displaced, 3 mm, posteriorly, and foreshortened by a similar degree. No fracture comminution. There is a nondisplaced fracture across the base of the medial malleolus best appreciated on the lateral view. No fracture comminution. There is also a fracture of the posterior malleolus, superimposed on the distal fibular fracture on the lateral view, without significant displacement and without comminution. Ankle joint is normally spaced and aligned. There is surrounding soft tissue swelling. IMPRESSION: 1. Trimalleolar fracture as detailed above. Ankle joint normally aligned. Electronically Signed   By: Amie Portland M.D.   On: 02/15/2023 17:31   DG Humerus Left  Result Date: 02/15/2023 CLINICAL DATA:  Larey Seat at the dialysis center.  Left arm pain. EXAM: LEFT HUMERUS - 2+ VIEW COMPARISON:  None Available. FINDINGS: There is no evidence of fracture or other focal bone lesions. Soft tissues are unremarkable. IMPRESSION: Negative. Electronically Signed   By: Amie Portland M.D.   On: 02/15/2023 17:28   DG Wrist Complete Right  Result Date: 02/15/2023 CLINICAL DATA:  Larey Seat at the dialysis center.  Right wrist pain. EXAM: RIGHT WRIST - COMPLETE 3+ VIEW COMPARISON:  None Available. FINDINGS: No fracture.  No bone lesion. Joints are normally spaced and aligned. There is significant dorsal soft tissue swelling. IMPRESSION: No fracture or dislocation. Electronically Signed   By: Amie Portland M.D.   On: 02/15/2023 17:28   DG Chest 2 View  Result Date: 02/15/2023 CLINICAL DATA:  Larey Seat at the dialysis center. EXAM: CHEST - 2 VIEW COMPARISON:  12/30/2022. FINDINGS: Cardiac silhouette is normal in size. No mediastinal or hilar masses or evidence of adenopathy. Small left pleural effusion. Linear opacity  in the left mid lung consistent with atelectasis. Remainder of the lungs is  clear. No right pleural effusion. No pneumothorax. Total left internal jugular central venous catheter is stable. Skeletal structures are demineralized, but intact. IMPRESSION: 1. No acute cardiopulmonary disease. 2. Small left pleural effusion. Electronically Signed   By: Amie Portland M.D.   On: 02/15/2023 17:26    Medications:  albumin human     albumin human      ceFAZolin (ANCEF) IV      Chlorhexidine Gluconate Cloth  6 each Topical Q0600   citalopram  20 mg Oral Daily   diltiazem  120 mg Oral Daily   docusate sodium  100 mg Oral QHS   ezetimibe  10 mg Oral Daily   fentaNYL (SUBLIMAZE) injection  25 mcg Intravenous Once   fluticasone furoate-vilanterol  1 puff Inhalation Daily   [START ON 02/18/2023] hydrocortisone  10 mg Oral BID   hydrocortisone sod succinate (SOLU-CORTEF) inj  50 mg Intravenous Q8H   insulin aspart  0-5 Units Subcutaneous QHS   insulin aspart  0-9 Units Subcutaneous TID WC   levothyroxine  25 mcg Oral Q0600   metoprolol succinate  25 mg Oral QHS   multivitamin with minerals  1 tablet Oral QPM   simvastatin  40 mg Oral QPM   umeclidinium bromide  1 puff Inhalation Daily    Dialysis Orders: TTS HD in [place], [place] (Dr Leretha Dykes, [place])  [facility]  need records  Home meds include - asa, celexa, zetia, breo ellipta, cortef, advil, synthroid, losartan 25 hs, toprol xl 24 qd, mvi, procardia xl 60 hs, prilosec, zocor, spiriva, prns   Assessment/Plan: Fall/ R ankle fracture - per ortho, s/p ORIF R ankle today Afib w/ RVR - on diltiazem gtt, and po BB ESRD - on HD TTS. On HD. Using L chest TDC.  HTN/ volume - BP's low to normal, euvolemic on exam. Small UFG next hd. IVFs recently stopped. Okay to give IV Albumin with HD today if SBP < 90. 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

## 2023-02-17 NOTE — Anesthesia Procedure Notes (Signed)
Procedure Name: MAC Date/Time: 02/17/2023 10:45 AM  Performed by: Quentin Ore, CRNAPre-anesthesia Checklist: Patient identified, Emergency Drugs available, Suction available and Patient being monitored Patient Re-evaluated:Patient Re-evaluated prior to induction Oxygen Delivery Method: Simple face mask Induction Type: IV induction Placement Confirmation: positive ETCO2 Dental Injury: Teeth and Oropharynx as per pre-operative assessment

## 2023-02-17 NOTE — Progress Notes (Signed)
Pt receives out-pt HD at [facility] on TTS. Pt arrives at 10:45 for 11:00 chair time. Will assist as needed.   Olivia Canter Renal Navigator [phone]

## 2023-02-17 NOTE — Plan of Care (Signed)

## 2023-02-17 NOTE — Anesthesia Postprocedure Evaluation (Signed)
Anesthesia Post Note  Patient: Kayla Vasquez  Procedure(s) Performed: OPEN REDUCTION INTERNAL FIXATION (ORIF) RIGHT ANKLE FRACTURE (Right: Ankle)     Patient location during evaluation: PACU Anesthesia Type: Regional Level of consciousness: awake and alert Pain management: pain level controlled Vital Signs Assessment: post-procedure vital signs reviewed and stable Respiratory status: spontaneous breathing, nonlabored ventilation, respiratory function stable and patient connected to nasal cannula oxygen Cardiovascular status: stable and blood pressure returned to baseline Postop Assessment: no apparent nausea or vomiting Anesthetic complications: no  No notable events documented.  Last Vitals:  Vitals:   02/17/23 1330 02/17/23 1345  BP: (!) 98/51 (!) 103/47  Pulse: 82 91  Resp: 20 16  Temp:    SpO2: 94% 96%    Last Pain:  Vitals:   02/17/23 1330  TempSrc:   PainSc: 0-No pain                 Trevor Iha

## 2023-02-17 NOTE — Transfer of Care (Signed)
Immediate Anesthesia Transfer of Care Note  Patient: [name]  Procedure(s) Performed: OPEN REDUCTION INTERNAL FIXATION (ORIF) RIGHT ANKLE FRACTURE (Right: Ankle)  Patient Location: PACU  Anesthesia Type:MAC combined with regional for post-op pain  Level of Consciousness: awake, alert , and oriented  Airway & Oxygen Therapy: Patient Spontanous Breathing and Patient connected to face mask oxygen  Post-op Assessment: Report given to RN, Post -op Vital signs reviewed and stable, and Patient moving all extremities  Post vital signs: Reviewed and stable  Last Vitals:  Vitals Value Taken Time  BP 83/40 02/17/23 1235  Temp    Pulse 74 02/17/23 1238  Resp 16 02/17/23 1238  SpO2 99 % 02/17/23 1238  Vitals shown include unvalidated device data.  Last Pain:  Vitals:   02/17/23 1000  TempSrc:   PainSc: 0-No pain      Patients Stated Pain Goal: 0 (02/17/23 0600)  Complications: No notable events documented.

## 2023-02-18 DIAGNOSIS — S82851A Displaced trimalleolar fracture of right lower leg, initial encounter for closed fracture: Secondary | ICD-10-CM | POA: Diagnosis not present

## 2023-02-18 LAB — CBC
HCT: 31 % — ABNORMAL LOW (ref 36.0–46.0)
Hemoglobin: 9.3 g/dL — ABNORMAL LOW (ref 12.0–15.0)
MCH: 30.5 pg (ref 26.0–34.0)
MCHC: 30 g/dL (ref 30.0–36.0)
MCV: 101.6 fL — ABNORMAL HIGH (ref 80.0–100.0)
Platelets: 165 10*3/uL (ref 150–400)
RBC: 3.05 MIL/uL — ABNORMAL LOW (ref 3.87–5.11)
RDW: 17.3 % — ABNORMAL HIGH (ref 11.5–15.5)
WBC: 8.7 10*3/uL (ref 4.0–10.5)
nRBC: 0 % (ref 0.0–0.2)

## 2023-02-18 LAB — BASIC METABOLIC PANEL
Anion gap: 12 (ref 5–15)
BUN: 24 mg/dL — ABNORMAL HIGH (ref 8–23)
CO2: 28 mmol/L (ref 22–32)
Calcium: 8.2 mg/dL — ABNORMAL LOW (ref 8.9–10.3)
Chloride: 93 mmol/L — ABNORMAL LOW (ref 98–111)
Creatinine, Ser: 3.54 mg/dL — ABNORMAL HIGH (ref 0.44–1.00)
GFR, Estimated: 13 mL/min — ABNORMAL LOW (ref >60)
Glucose, Bld: 130 mg/dL — ABNORMAL HIGH (ref 70–99)
Potassium: 3.8 mmol/L (ref 3.5–5.1)
Sodium: 133 mmol/L — ABNORMAL LOW (ref 135–145)

## 2023-02-18 LAB — GLUCOSE, CAPILLARY
Glucose-Capillary: 126 mg/dL — ABNORMAL HIGH (ref 70–99)
Glucose-Capillary: 120 mg/dL — ABNORMAL HIGH (ref 70–99)
Glucose-Capillary: 104 mg/dL — ABNORMAL HIGH (ref 70–99)
Glucose-Capillary: 122 mg/dL — ABNORMAL HIGH (ref 70–99)

## 2023-02-18 LAB — VITAMIN D 25 HYDROXY (VIT D DEFICIENCY, FRACTURES): Vit D, 25-Hydroxy: 42.64 ng/mL (ref 30–100)

## 2023-02-18 LAB — ABO/RH: ABO/RH(D): O POS

## 2023-02-18 LAB — MAGNESIUM: Magnesium: 1.8 mg/dL (ref 1.7–2.4)

## 2023-02-18 LAB — PHOSPHORUS: Phosphorus: 5.3 mg/dL — ABNORMAL HIGH (ref 2.5–4.6)

## 2023-02-18 LAB — HEPATITIS B SURFACE ANTIBODY, QUANTITATIVE: Hep B S AB Quant (Post): <3.5 m[IU]/mL — ABNORMAL LOW (ref >9.9)

## 2023-02-18 MED ORDER — ACETAMINOPHEN 500 MG PO TABS
1000.0000 mg | ORAL_TABLET | Freq: Three times a day (TID) | ORAL | Status: AC
  Administered 2023-02-18 – 2023-02-19 (×4): 1000 mg via ORAL
  Filled 2023-02-18 (×4): qty 2

## 2023-02-18 MED ORDER — HYDROCORTISONE 20 MG PO TABS
20.0000 mg | ORAL_TABLET | Freq: Two times a day (BID) | ORAL | Status: DC
  Administered 2023-02-18 – 2023-02-20 (×5): 20 mg via ORAL
  Filled 2023-02-18 (×5): qty 1

## 2023-02-18 MED ORDER — OXYCODONE HCL 5 MG PO TABS
10.0000 mg | ORAL_TABLET | ORAL | Status: DC | PRN
  Administered 2023-02-18 – 2023-02-21 (×5): 10 mg via ORAL
  Filled 2023-02-18 (×6): qty 2

## 2023-02-18 MED ORDER — SIMETHICONE 80 MG PO CHEW
80.0000 mg | CHEWABLE_TABLET | Freq: Four times a day (QID) | ORAL | Status: DC
  Administered 2023-02-18 – 2023-02-21 (×8): 80 mg via ORAL
  Filled 2023-02-18 (×8): qty 1

## 2023-02-18 MED ORDER — DARBEPOETIN ALFA 25 MCG/0.42ML IJ SOSY
25.0000 ug | PREFILLED_SYRINGE | INTRAMUSCULAR | Status: DC
  Filled 2023-02-18: qty 0.42

## 2023-02-18 MED ORDER — HYDROMORPHONE HCL 1 MG/ML IJ SOLN
0.5000 mg | INTRAMUSCULAR | Status: DC | PRN
  Administered 2023-02-18 (×3): 0.5 mg via INTRAVENOUS
  Filled 2023-02-18 (×3): qty 0.5

## 2023-02-18 MED ORDER — SODIUM CHLORIDE 0.9 % IV BOLUS
500.0000 mL | Freq: Once | INTRAVENOUS | Status: AC
  Administered 2023-02-18: 500 mL via INTRAVENOUS

## 2023-02-18 MED ORDER — PARICALCITOL 5 MCG/ML IV SOLN
3.5000 ug | INTRAVENOUS | Status: DC
  Filled 2023-02-18 (×3): qty 0.7

## 2023-02-18 MED ORDER — SORBITOL 70 % SOLN: 960.0000 mL | TOPICAL_OIL | Freq: Every day | ORAL | Status: DC | PRN

## 2023-02-18 MED ORDER — SENNOSIDES-DOCUSATE SODIUM 8.6-50 MG PO TABS
1.0000 | ORAL_TABLET | Freq: Two times a day (BID) | ORAL | Status: DC
  Administered 2023-02-18 (×2): 1 via ORAL
  Filled 2023-02-18 (×2): qty 1

## 2023-02-18 MED ORDER — ENOXAPARIN SODIUM 30 MG/0.3ML IJ SOSY: 30.0000 mg | PREFILLED_SYRINGE | INTRAMUSCULAR | Status: DC

## 2023-02-18 MED ORDER — IRON DEXTRAN 50 MG/ML IJ SOLN
100.0000 mg | Freq: Once | INTRAMUSCULAR | Status: DC
  Filled 2023-02-18 (×2): qty 2

## 2023-02-18 MED ORDER — HYDROMORPHONE HCL 1 MG/ML IJ SOLN
0.5000 mg | INTRAMUSCULAR | Status: DC | PRN
  Administered 2023-02-18 – 2023-02-21 (×4): 0.5 mg via INTRAVENOUS
  Filled 2023-02-18 (×4): qty 0.5

## 2023-02-18 MED ORDER — BISACODYL 10 MG RE SUPP
10.0000 mg | Freq: Once | RECTAL | Status: AC
  Administered 2023-02-18: 10 mg via RECTAL
  Filled 2023-02-18: qty 1

## 2023-02-18 NOTE — Progress Notes (Signed)
Received patient in bed to unit.  Alert and oriented.  Informed consent signed and in chart.   TX duration:3.5  Patient tolerated well.  Transported back to the room  Alert, without acute distress.  Hand-off given to patient's nurse.   Access used: Catheter Access issues: none  Total UF removed: 500 mls Medication(s) given: none Post HD VS: 106/75 Post HD weight: 93.1 kg    02/17/23 1955  Vitals  Temp 98.4 F (36.9 C)  Temp Source Oral  BP 106/75  MAP (mmHg) 85  BP Location Left Arm  BP Method Automatic  Patient Position (if appropriate) Lying  Pulse Rate Source Monitor  ECG Heart Rate 97  Resp 18  Oxygen Therapy  SpO2 100 %  O2 Device Nasal Cannula  O2 Flow Rate (L/min) 2 L/min  Patient Activity (if Appropriate) In bed  Pulse Oximetry Type Continuous  During Treatment Monitoring  Intra-Hemodialysis Comments Tx completed;Tolerated well  Post Treatment  Dialyzer Clearance Lightly streaked  Duration of HD Treatment -hour(s) 3.3 hour(s)  Hemodialysis Intake (mL) 0 mL  Liters Processed 78.3  Fluid Removed (mL) 500 mL  Tolerated HD Treatment Yes  Post-Hemodialysis Comments HD tx achieved as expected without complications, tolerated well, some hypotensive at times.  no complaints and pt is stable.  AVG/AVF Arterial Site Held (minutes) 0 minutes  AVG/AVF Venous Site Held (minutes) 0 minutes  Note  Observations pt is the resting  Hemodialysis Catheter Left Subclavian Double lumen Temporary (Non-Tunneled)  No placement date or time found.   Orientation: Left  Access Location: Subclavian  Hemodialysis Catheter Type: Double lumen Temporary (Non-Tunneled)  Site Condition No complications  Blue Lumen Status Infusing;Heparin locked  Red Lumen Status Flushed;Heparin locked;Dead end cap in place  Purple Lumen Status N/A  Catheter fill solution Heparin 1000 units/ml  Catheter fill volume (Arterial) 1.9 cc  Catheter fill volume (Venous) 1.9  Dressing Type Gauze/Drain  sponge  Dressing Status Antimicrobial disc in place  Interventions Dressing reinforced  Drainage Description None  Dressing Change Due 03/24/23

## 2023-02-18 NOTE — Progress Notes (Signed)
Patient back to 4E07 from dialysis. Report received per dialysis nurse. PT AO x4, denies any pain. Breathing even and unlabored in 2l o2 via Jud. CCMD notified. Call bell within reach, will continue to monitor.

## 2023-02-18 NOTE — Progress Notes (Signed)
ANTICOAGULATION CONSULT NOTE - Initial Consult  Pharmacy Consult for enoxaparin Indication: VTE prophylaxis  Allergies  Allergen Reactions   Amoxicillin-Pot Clavulanate Rash   Codeine Nausea And Vomiting   Venlafaxine Other (See Comments)    Hallucination       Patient Measurements: Height: 5\' 6"  (167.6 cm) Weight: 93.1 kg (205 lb 4 oz) IBW/kg (Calculated) : 59.3   Vital Signs: Temp: 98.3 F (36.8 C) (05/17 1155) Temp Source: Oral (05/17 1155) BP: 109/88 (05/17 1155) Pulse Rate: 99 (05/17 1155)  Labs: Recent Labs    02/15/23 1804 02/17/23 0210 02/18/23 0159  HGB 12.2 11.7* 9.3*  HCT 41.5 38.7 31.0*  PLT 183 209 165  CREATININE 2.36* 5.07* 3.54*    Estimated Creatinine Clearance: 14.8 mL/min (A) (by C-G formula based on SCr of 3.54 mg/dL (H)).   Medical History: Past Medical History:  Diagnosis Date   Cancer (HCC)    High cholesterol       Assessment: 82 yo W with ESRD on dialysis now s/p ankle fracture ORIF. Pharmacy consulted for enoxaparin dosing for VTE prophylaxis.   Due to renal function, will dose at 30mg  Q24hr.   Goal of Therapy:   Monitor platelets by anticoagulation protocol: Yes   Plan:  Enoxaparin 30mg  Q24hr Monitor for signs/symptoms of bleeding   Alphia Moh, PharmD, BCPS, BCCP Clinical Pharmacist  Please check AMION for all [facility] Pharmacy phone numbers After 10:00 PM, call Main Pharmacy [phone]

## 2023-02-18 NOTE — Plan of Care (Signed)
Problem: Education: Goal: Knowledge of General Education information will improve Description: Including pain rating scale, medication(s)/side effects and non-pharmacologic comfort measures 02/18/2023 0109 by Orson Ape, RN Outcome: Progressing 02/18/2023 0109 by Orson Ape, RN Outcome: Progressing   Problem: Health Behavior/Discharge Planning: Goal: Ability to manage health-related needs will improve 02/18/2023 0109 by Orson Ape, RN Outcome: Progressing 02/18/2023 0109 by Orson Ape, RN Outcome: Progressing   Problem: Clinical Measurements: Goal: Ability to maintain clinical measurements within normal limits will improve 02/18/2023 0109 by Orson Ape, RN Outcome: Progressing 02/18/2023 0109 by Orson Ape, RN Outcome: Progressing Goal: Will remain free from infection 02/18/2023 0109 by Orson Ape, RN Outcome: Progressing 02/18/2023 0109 by Orson Ape, RN Outcome: Progressing Goal: Diagnostic test results will improve 02/18/2023 0109 by Orson Ape, RN Outcome: Progressing 02/18/2023 0109 by Orson Ape, RN Outcome: Progressing Goal: Respiratory complications will improve 02/18/2023 0109 by Orson Ape, RN Outcome: Progressing 02/18/2023 0109 by Orson Ape, RN Outcome: Progressing Goal: Cardiovascular complication will be avoided 02/18/2023 0109 by Orson Ape, RN Outcome: Progressing 02/18/2023 0109 by Orson Ape, RN Outcome: Progressing   Problem: Activity: Goal: Risk for activity intolerance will decrease 02/18/2023 0109 by Orson Ape, RN Outcome: Progressing 02/18/2023 0109 by Orson Ape, RN Outcome: Progressing   Problem: Nutrition: Goal: Adequate nutrition will be maintained 02/18/2023 0109 by Orson Ape, RN Outcome: Progressing 02/18/2023 0109 by Orson Ape, RN Outcome: Progressing   Problem: Coping: Goal: Level of anxiety will decrease 02/18/2023  0109 by Orson Ape, RN Outcome: Progressing 02/18/2023 0109 by Orson Ape, RN Outcome: Progressing   Problem: Elimination: Goal: Will not experience complications related to bowel motility 02/18/2023 0109 by Orson Ape, RN Outcome: Progressing 02/18/2023 0109 by Orson Ape, RN Outcome: Progressing Goal: Will not experience complications related to urinary retention 02/18/2023 0109 by Orson Ape, RN Outcome: Progressing 02/18/2023 0109 by Orson Ape, RN Outcome: Progressing   Problem: Pain Managment: Goal: General experience of comfort will improve 02/18/2023 0109 by Orson Ape, RN Outcome: Progressing 02/18/2023 0109 by Orson Ape, RN Outcome: Progressing   Problem: Safety: Goal: Ability to remain free from injury will improve 02/18/2023 0109 by Orson Ape, RN Outcome: Progressing 02/18/2023 0109 by Orson Ape, RN Outcome: Progressing   Problem: Skin Integrity: Goal: Risk for impaired skin integrity will decrease 02/18/2023 0109 by Orson Ape, RN Outcome: Progressing 02/18/2023 0109 by Orson Ape, RN Outcome: Progressing   Problem: Education: Goal: Ability to describe self-care measures that may prevent or decrease complications (Diabetes Survival Skills Education) will improve 02/18/2023 0109 by Orson Ape, RN Outcome: Progressing 02/18/2023 0109 by Orson Ape, RN Outcome: Progressing Goal: Individualized Educational Video(s) 02/18/2023 0109 by Orson Ape, RN Outcome: Progressing 02/18/2023 0109 by Orson Ape, RN Outcome: Progressing   Problem: Coping: Goal: Ability to adjust to condition or change in health will improve 02/18/2023 0109 by Orson Ape, RN Outcome: Progressing 02/18/2023 0109 by Orson Ape, RN Outcome: Progressing   Problem: Fluid Volume: Goal: Ability to maintain a balanced intake and output will improve 02/18/2023 0109 by Orson Ape, RN Outcome: Progressing 02/18/2023 0109 by Orson Ape, RN Outcome: Progressing   Problem: Health Behavior/Discharge Planning: Goal: Ability to identify and utilize available resources and services will improve 02/18/2023 0109 by Orson Ape, RN Outcome: Progressing 02/18/2023 0109 by Orson Ape, RN Outcome: Progressing Goal: Ability to manage health-related needs will improve 02/18/2023 0109 by Orson Ape, RN Outcome: Progressing 02/18/2023 0109 by Orson Ape, RN Outcome: Progressing   Problem: Metabolic: Goal: Ability to maintain appropriate glucose levels will improve  Outcome: Progressing   Problem: Nutritional: Goal: Maintenance of adequate nutrition will improve Outcome: Progressing Goal: Progress toward achieving an optimal weight will improve Outcome: Progressing   Problem: Skin Integrity: Goal: Risk for impaired skin integrity will decrease Outcome: Progressing   Problem: Tissue Perfusion: Goal: Adequacy of tissue perfusion will improve Outcome: Progressing

## 2023-02-18 NOTE — Plan of Care (Signed)

## 2023-02-18 NOTE — Evaluation (Addendum)
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   Clinical Impression   Pt admitted for above dx, PTA pt reports living alone and occassionally using w/c to get to appointments, independent in bADLs/iADLs and driving. Pt is a poor historian and no family around to determine baseline, appears a bit delirious and reports seeing a spider on OT's face and something crawling on the wall. Pt adamantly declining EOB mobility despite OT efforts, complete bed level exercises instead (see below). Pt with limited command following, needing AAROM assist to perform exercises, unsure if this due to cog or generalized weakness. Pt very limited by pain with functional mobility, educated pt to perform rolls to L side to prevent bed sores since she needs Mod A advised her to ask RN for help every 2hrs. Pt would benefit from continued acute skilled OT to address above deficits and help transition to next level of care.  Patient would benefit from post acute skilled rehab facility with <3 hours of therapy and 24/7 support      Recommendations for follow up therapy are one component of a multi-disciplinary discharge planning process, led by the attending physician.  Recommendations may be updated based on patient status, additional functional criteria and insurance authorization.   Assistance Recommended at Discharge Frequent or constant Supervision/Assistance  Patient can return home with the following Two people to help with walking and/or transfers;A lot of help with bathing/dressing/bathroom;Assistance with cooking/housework;Direct supervision/assist for medications management;Assist for transportation;Help with stairs or ramp for  entrance;Direct supervision/assist for financial management;Assistance with feeding    Functional Status Assessment  Patient has had a recent decline in their functional status and demonstrates the ability to make significant improvements in function in a reasonable and predictable amount of time.  Equipment Recommendations  Other (comment) (pending pt progression, no recs at this time)    Recommendations for Other Services       Precautions / Restrictions Precautions Precautions: Fall Precaution Comments: extreme pain Required Braces or Orthoses: Splint/Cast Splint/Cast: R Lower leg Restrictions Weight Bearing Restrictions: Yes RLE Weight Bearing: Non weight bearing      Mobility Bed Mobility Overal bed mobility: Needs Assistance Bed Mobility: Rolling Rolling: Mod assist (Roll to L but not to the R)         General bed mobility comments: very limited by pain    Transfers                   General transfer comment: Deferred      Balance Overall balance assessment: History of Falls (not able to assess)                                         ADL either performed or assessed with clinical judgement   ADL Overall ADL's : Needs assistance/impaired Eating/Feeding: Bed level;Moderate assistance   Grooming: Bed level;Maximal assistance   Upper Body Bathing: Bed level;Maximal assistance   Lower Body Bathing: Bed level;Total assistance   Upper Body Dressing : Bed level;Maximal assistance   Lower Body Dressing: Bed level;Total assistance     Toilet Transfer Details (indicate cue type and reason):  likely bed pan level Toileting- Clothing Manipulation and Hygiene: Bed level         General ADL Comments: Pt adamantly refused mobility to EOB despite consistent reassurace and encouragement.     Vision         Perception     Praxis      Pertinent Vitals/Pain Pain Assessment Pain Assessment: Faces Faces Pain Scale: Hurts  worst Pain Location: RLE with any movement Pain Descriptors / Indicators: Constant, Crying, Grimacing, Guarding, Sharp, Moaning Pain Intervention(s): Limited activity within patient's tolerance, Monitored during session, Repositioned (Elevated RLE)     Hand Dominance Right   Extremity/Trunk Assessment Upper Extremity Assessment Upper Extremity Assessment: Difficult to assess due to impaired cognition RUE Deficits / Details: Pain at forearm and wrist, significant bruising dorsolateral aspect of R wrist; able to lift UE up against gravity and actively move fingers   Lower Extremity Assessment Lower Extremity Assessment: Difficult to assess due to impaired cognition (Difficult to fully assess due to cog) RLE Deficits / Details: sensation intact to light touch at R toes, with + active toe flex/extend; Significant pain limiting all other movement of RLE; repositioned in elevation       Communication Communication Communication: No difficulties   Cognition Arousal/Alertness: Awake/alert Behavior During Therapy: WFL for tasks assessed/performed, Restless Overall Cognitive Status: No family/caregiver present to determine baseline cognitive functioning                                 General Comments: Pt severely limited by pain, inconsistently following commands, she claims to have seen something "crawling" up the wall although nothing was there. Attempted AROM with pt, unsure if she is too weak to move BLEs but needing assist to lift non affected leg as well     General Comments  Pt in severe RLE pain, repositioned and elevated RLE    Exercises General Exercises - Lower Extremity Ankle Circles/Pumps: Left, AAROM, 10 reps, Supine Quad Sets: Both, 10 reps Straight Leg Raises: AAROM, 10 reps, Supine, Left   Shoulder Instructions      Home Living Family/patient expects to be discharged to:: Private residence Living Arrangements: Alone Available Help at Discharge:  Family Type of Home: Mobile home Home Access: Ramped entrance     Home Layout: One level     Bathroom Shower/Tub: Arts development officer Toilet: Handicapped height     Home Equipment: Shower seat - built in;Shower seat;Grab bars - toilet;Grab bars - tub/shower;Rolling Walker (2 wheels);Cane - single point   Additional Comments: Will need more information re: bathroom setup      Prior Functioning/Environment Prior Level of Function : Independent/Modified Independent;Driving             Mobility Comments: No assistive device needed. uses w/c for doctors visits ADLs Comments: Drives herself to/from HD, only recently started using the [employer] service. ind in bADLs/iADLs.        OT Problem List: Decreased strength;Impaired balance (sitting and/or standing);Pain;Decreased activity tolerance;Decreased cognition      OT Treatment/Interventions: Self-care/ADL training;Energy conservation;Therapeutic exercise;DME and/or AE instruction;Therapeutic activities;Patient/family education;Balance training (until pain is more tolerable)    OT Goals(Current goals can be found in the care plan section) Acute Rehab OT Goals Patient Stated Goal: none stated OT Goal Formulation: With patient Time For Goal Achievement: 03/04/23 Potential to Achieve Goals: Fair ADL Goals Pt Will Perform Grooming: sitting;with supervision;with set-up Pt Will Transfer to Toilet: bedside  commode;squat pivot transfer;with min assist Additional ADL Goal #1: Pt will demonstrate compliance with bed level exercise program to promote/maintain strength Additional ADL Goal #2: pt will be able to tolearte EOB for ~5 mins in prep for grooming  OT Frequency: Min 1X/week    Co-evaluation              AM-PAC OT "6 Clicks" Daily Activity     Outcome Measure Help from another person eating meals?: A Lot Help from another person taking care of personal grooming?: A Lot Help from another person toileting, which  includes using toliet, bedpan, or urinal?: Total Help from another person bathing (including washing, rinsing, drying)?: Total Help from another person to put on and taking off regular upper body clothing?: A Lot Help from another person to put on and taking off regular lower body clothing?: Total 6 Click Score: 9   End of Session Nurse Communication: Need for lift equipment;Weight bearing status (bed level, RLE NWB)  Activity Tolerance: Patient limited by pain Patient left: in bed;with call bell/phone within reach;with bed alarm set  OT Visit Diagnosis: History of falling (Z91.81);Unsteadiness on feet (R26.81);Muscle weakness (generalized) (M62.81);Other abnormalities of gait and mobility (R26.89);Pain Pain - Right/Left: Right Pain - part of body: Leg                Time: 4098-1191 OT Time Calculation (min): 18 min Charges:  OT General Charges $OT Visit: 1 Visit OT Evaluation $OT Eval Moderate Complexity: 1 Mod  02/18/2023  AB, OTR/L  Acute Rehabilitation Services  Office: [phone]   Tristan Schroeder 02/18/2023, 1:01 PM

## 2023-02-18 NOTE — Care Management Important Message (Signed)
Important Message  Patient Details  Name: Kayla Vasquez MRN: [id number] Date of Birth: 1941/07/12   Medicare Important Message Given:  Yes     Renie Ora 02/18/2023, 8:30 AM

## 2023-02-18 NOTE — Progress Notes (Signed)
Physical Therapy Treatment Patient Details Name: Kayla Vasquez MRN: [id number] DOB: 1941/05/15 Today's Date: 02/18/2023   History of Present Illness Pt is a 82 y/o F admitted on 02/15/23 after presenting with c/o mechanical fall. Pt found to have trimalleolar fx & developed a-fib with RVR on 02/16/23 requiring Cardizem drip. Pt is s/p ORIF R ankle without fixation of the posterior lip, manual application of stress ankle syndesmosis under fluoroscopy on 02/17/23. PMH: ERD, CA, high cholesterol    PT Comments    Pt seen for PT tx with pt agreeable to tx. Daughter arrives mid way through session & confirms pt was independent without AD prior to admission, doing better & becoming more mobile since starting dialysis, & pt without other falls besides this one. Daughter does assist with getting groceries for pt, but pt is able to manage her own medications. On this date, pt requires mod<>max assist for supine>sit, mod assist sit>supine & +2 to scoot to [facility]. Pt tolerates sitting EOB <1 minute as she reports fatigue & returns to supine despite encouragement from PT. PT educated pt on progression of mobility & importance of participation & OOB mobility. Pt's daughter feels pt's cognition is impaired 2/2 pain medication - MD & nurse notified. Will continue to follow pt acutely to address balance, strength, & activity tolerance to increase independence with bed mobility & transfers & decrease caregiver burden.   Recommendations for follow up therapy are one component of a multi-disciplinary discharge planning process, led by the attending physician.  Recommendations may be updated based on patient status, additional functional criteria and insurance authorization.  Follow Up Recommendations  Can patient physically be transported by private vehicle: No    Assistance Recommended at Discharge Frequent or constant Supervision/Assistance  Patient can return home with the following Two people to help with walking  and/or transfers;Two people to help with bathing/dressing/bathroom;Direct supervision/assist for medications management;Assist for transportation;Help with stairs or ramp for entrance;Direct supervision/assist for financial management;Assistance with cooking/housework   Equipment Recommendations  None recommended by PT (TBD in next venue)    Recommendations for Other Services       Precautions / Restrictions Precautions Precautions: Fall Precaution Comments: extreme pain Required Braces or Orthoses: Splint/Cast Splint/Cast: R Lower leg Restrictions Weight Bearing Restrictions: Yes RLE Weight Bearing: Non weight bearing     Mobility  Bed Mobility Overal bed mobility: Needs Assistance Bed Mobility: Supine to Sit, Sit to Supine     Supine to sit: Mod assist, HOB elevated, Max assist Sit to supine: Mod assist, HOB elevated   General bed mobility comments: Max cuing, assistance to move BLE to EOB & upright trunk. Pt is able to transition sit>supine by lowering trunk down but requires mod assist to elevate BLE onto bed. Pt with little participation in repositioning straight in bed & requires +2 assist to scoot to [facility].    Transfers                        Ambulation/Gait                   Stairs             Wheelchair Mobility    Modified Rankin (Stroke Patients Only)       Balance Overall balance assessment: Needs assistance Sitting-balance support: Feet supported, Bilateral upper extremity supported Sitting balance-Leahy Scale: Poor Sitting balance - Comments: close supervision<>CGA static sitting  Cognition Arousal/Alertness: Awake/alert Behavior During Therapy: WFL for tasks assessed/performed Overall Cognitive Status: Impaired/Different from baseline Area of Impairment: Orientation, Attention, Memory, Following commands, Awareness, Safety/judgement, Problem solving                  Orientation Level: Disoriented to, Place (reported she was at [facility] - PT oriented pt to [facility])   Memory: Decreased recall of precautions, Decreased short-term memory Following Commands: Follows one step commands inconsistently, Follows one step commands consistently Safety/Judgement: Decreased awareness of safety, Decreased awareness of deficits Awareness: Intellectual Problem Solving: Decreased initiation, Slow processing, Requires verbal cues, Requires tactile cues General Comments: Also limited by [facility] but pt does not wear hearing aides, states she feels one ear is better than the other but unable to state which.        Exercises      General Comments General comments (skin integrity, edema, etc.): Pt on 2L/min supplemental O2 via nasal cannula with SpO2 86-91%. PT educated pt & daughter on importance of participation with PT & OOB mobility to prevent deconditioning & pressure sores.      Pertinent Vitals/Pain Pain Assessment Pain Assessment: Faces Faces Pain Scale: Hurts even more Pain Location: RLE with movement Pain Descriptors / Indicators: Grimacing, Guarding, Discomfort Pain Intervention(s): Monitored during session, Repositioned, Limited activity within patient's tolerance, Premedicated before session    Home Living Family/patient expects to be discharged to:: Private residence Living Arrangements: Alone Available Help at Discharge: Family Type of Home: Mobile home Home Access: Ramped entrance       Home Layout: One level Home Equipment: Shower seat - built in;Shower seat;Grab bars - toilet;Grab bars - tub/shower;Rolling Walker (2 wheels);Cane - single point Additional Comments: Will need more information re: bathroom setup    Prior Function            PT Goals (current goals can now be found in the care plan section) Acute Rehab PT Goals Patient Stated Goal: get better, decreased pain PT Goal Formulation: With patient/family Potential to  Achieve Goals: Fair Progress towards PT goals: Progressing toward goals    Frequency    Min 3X/week      PT Plan Frequency needs to be updated    Co-evaluation              AM-PAC PT "6 Clicks" Mobility   Outcome Measure  Help needed turning from your back to your side while in a flat bed without using bedrails?: A Little Help needed moving from lying on your back to sitting on the side of a flat bed without using bedrails?: A Lot Help needed moving to and from a bed to a chair (including a wheelchair)?: Total Help needed standing up from a chair using your arms (e.g., wheelchair or bedside chair)?: Total Help needed to walk in hospital room?: Total Help needed climbing 3-5 steps with a railing? : Total 6 Click Score: 9    End of Session Equipment Utilized During Treatment: Oxygen Activity Tolerance: Patient limited by pain;Patient limited by fatigue Patient left: in bed;with call bell/phone within reach;with bed alarm set;with family/visitor present Nurse Communication:  (O2, daughter concerned pain meds is causing impaired cognition) PT Visit Diagnosis: Pain;Other abnormalities of gait and mobility (R26.89);Muscle weakness (generalized) (M62.81);Difficulty in walking, not elsewhere classified (R26.2) Pain - Right/Left: Right Pain - part of body: Leg;Ankle and joints of foot     Time: 9629-5284 PT Time Calculation (min) (ACUTE ONLY): 17 min  Charges:  $Therapeutic Activity: 8-22  mins                     Aleda Grana, PT, DPT 02/18/23, 1:57 PM   Sandi Mariscal 02/18/2023, 1:55 PM

## 2023-02-18 NOTE — Progress Notes (Signed)
Orthopaedic Trauma Service Progress Note  Patient ID: Kayla Vasquez MRN: [id number] DOB/AGE: 1941-02-25 82 y.o.  Subjective:  Ortho issues stable   ROS As above   Objective:   VITALS:   Vitals:   02/18/23 0551 02/18/23 0555 02/18/23 0833 02/18/23 1155  BP: 120/61  (!) 105/47 109/88  Pulse: 95  95 99  Resp: 18 20 20 20   Temp:   97.6 F (36.4 C) 98.3 F (36.8 C)  TempSrc:   Oral Oral  SpO2: 96% 95% 94% 93%  Weight:      Height:        Estimated body mass index is 33.13 kg/m as calculated from the following:   Height as of this encounter: 5\' 6"  (1.676 m).   Weight as of this encounter: 93.1 kg.   Intake/Output      05/16 0701 05/17 0700 05/17 0701 05/18 0700   P.O. 100    I.V. (mL/kg) 200 (2.1)    IV Piggyback 700    Total Intake(mL/kg) 1000 (10.7)    Urine (mL/kg/hr)     Emesis/NG output     Other 500    Stool     Blood 25    Total Output 525    Net +475           LABS  Results for orders placed or performed during the hospital encounter of 02/15/23 (from the past 24 hour(s))  Glucose, capillary     Status: Abnormal   Collection Time: 02/17/23  9:49 PM  Result Value Ref Range   Glucose-Capillary 142 (H) 70 - 99 mg/dL   Comment 1 Notify RN    Comment 2 Document in Chart   Basic metabolic panel     Status: Abnormal   Collection Time: 02/18/23  1:59 AM  Result Value Ref Range   Sodium 133 (L) 135 - 145 mmol/L   Potassium 3.8 3.5 - 5.1 mmol/L   Chloride 93 (L) 98 - 111 mmol/L   CO2 28 22 - 32 mmol/L   Glucose, Bld 130 (H) 70 - 99 mg/dL   BUN 24 (H) 8 - 23 mg/dL   Creatinine, Ser 8.65 (H) 0.44 - 1.00 mg/dL   Calcium 8.2 (L) 8.9 - 10.3 mg/dL   GFR, Estimated 13 (L) >60 mL/min   Anion gap 12 5 - 15  CBC     Status: Abnormal   Collection Time: 02/18/23  1:59 AM  Result Value Ref Range   WBC 8.7 4.0 - 10.5 K/uL   RBC 3.05 (L) 3.87 - 5.11 MIL/uL   Hemoglobin 9.3 (L) 12.0 -  15.0 g/dL   HCT 78.4 (L) 69.6 - 29.5 %   MCV 101.6 (H) 80.0 - 100.0 fL   MCH 30.5 26.0 - 34.0 pg   MCHC 30.0 30.0 - 36.0 g/dL   RDW 28.4 (H) 13.2 - 44.0 %   Platelets 165 150 - 400 K/uL   nRBC 0.0 0.0 - 0.2 %  Magnesium     Status: None   Collection Time: 02/18/23  1:59 AM  Result Value Ref Range   Magnesium 1.8 1.7 - 2.4 mg/dL  VITAMIN D 25 Hydroxy (Vit-D Deficiency, Fractures)     Status: None   Collection Time: 02/18/23  1:59 AM  Result Value Ref Range   Vit D, 25-Hydroxy 42.64  30 - 100 ng/mL  Phosphorus     Status: Abnormal   Collection Time: 02/18/23  1:59 AM  Result Value Ref Range   Phosphorus 5.3 (H) 2.5 - 4.6 mg/dL  Glucose, capillary     Status: Abnormal   Collection Time: 02/18/23  6:25 AM  Result Value Ref Range   Glucose-Capillary 126 (H) 70 - 99 mg/dL   Comment 1 Notify RN    Comment 2 Document in Chart   Glucose, capillary     Status: Abnormal   Collection Time: 02/18/23 12:00 PM  Result Value Ref Range   Glucose-Capillary 120 (H) 70 - 99 mg/dL  ABO/Rh     Status: None   Collection Time: 02/18/23 12:40 PM  Result Value Ref Range   ABO/RH(D)      O POS Performed at [facility], [address], [place], [place] [address]      PHYSICAL EXAM:   Gen: in bed, NAD, was resting comfortably until I asked about her ankle then started to complain of pain  Ext:       Right Lower Extremity   SLS fitting well  Ext warm   Brisk cap refill   Moves toes   No sensation indicating block is still working   Good perfusion distally   Minimal swelling    Assessment/Plan: 1 Day Post-Op     Anti-infectives (From admission, onward)    Start     Dose/Rate Route Frequency Ordered Stop   02/17/23 2200  ceFAZolin (ANCEF) IVPB 1 g/50 mL premix        1 g 100 mL/hr over 30 Minutes Intravenous  Once 02/17/23 1439 02/17/23 2357   02/17/23 0730  ceFAZolin (ANCEF) IVPB 2g/100 mL premix        2 g 200 mL/hr over 30 Minutes Intravenous On call to O.R. 02/17/23 6045  02/17/23 1055     .  POD/HD#: 1  82 y/o female with complex medical history including ESRD on dialysis with R trimalleolar ankle fracture   - fall  -R trimalleolar ankle fracture s/p ORIF. 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 [place] [address] [phone] [phone] (F)    After 5pm and on the weekends please log on to Amion, go to orthopaedics and the look under the [facility] Call for the provider(s) on call. You can also call our office at [phone] and then follow the prompts to be connected to the call team.  Patient ID:  Kayla Vasquez, female   DOB: 08-20-1941, 82 y.o.   MRN: [id number]

## 2023-02-18 NOTE — Progress Notes (Signed)
[facility] Progress Note Patient: Kayla Vasquez OZH:[id number] DOB: [birth year] DOA: 02/15/2023  DOS: the patient was seen and examined on 02/18/2023  Brief hospital course: PMH of ESRD on HD TTS, HLD, breast cancer, paroxysmal A-fib, GI bleed not on any anticoagulation, nonobstructive CAD, hypothyroidism, adrenal insufficiency, COPD presented to the hospital with complaints of mechanical fall. She was getting into the transport vehicle after her HD and missed a step and fell down injuring her right ankle, right wrist and left arm.  Found to have trimalleolar fracture.  Developed A-fib with RVR on 5/15 requiring Cardizem drip. Having significant issues with pain control.  Developing delirium most likely secondary to psychotropic medications as well as postanesthesia effect. Assessment and Plan: Trimalleolar right ankle fracture closed. After mechanical fall. Orthopedic consulted. Underwent ORIF of the ankle fracture and manual application of stress ankle syndesmosis. The patient is having severe pain postoperatively. Appears to also have developed delirium postoperatively. Postop DVT Px, Weight bearing status, wound management and pain control per Orthopedics.  Preoperative cardiac evaluation Tolerated surgery without any cardiac issues.  Paroxysmal A-fib with RVR. Known history of paroxysmal A-fib with RVR preoperatively secondary to pain. Initiated on Cardizem drip. Hold nifedipine.  Now on oral Cardizem. Continue metoprolol. Limited echocardiogram reassuring with normal EF Not on anticoagulation due to prior history of GI bleed.  ESRD HD TTS. Nephrology consulted. Continue HD per schedule.  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 Acute.  Blood loss anemia Baseline hemoglobin around 12.  Dropped down to 9.3 postoperatively. No active bleeding seen. Will monitor for now.  Nonobstructive CAD. Stress test in 2019 was negative for any acute  abnormality. Echocardiogram recently showed EF of 60-65%. Repeated this admission and EF remains same Echocardiogram showed no wall motion abnormality or significant valvular abnormality. Monitor for now.  Postop ileus. Developed abdominal pain on 5/16.  X-ray abdomen shows evidence of possible ileus as well as constipation. Currently initiated on bowel regimen. Given that the patient does not have any nausea or vomiting and abdominal pain also appears to have resolved will advance to clear liquid diet. Repeat x-ray abdomen from 5/18  Adrenal insufficiency. Patient is on Cortef 5 mg twice daily.  Did not receive any stress dose pre or intraoperatively. Postoperatively received Solu-Cortef 50 mg on 5/16.  Will increase oral Solu-Cortef to 20 mg twice daily for now.  Hypothyroidism. Continue Synthroid.  Mood disorder. Continue Celexa.  HTN. Holding losartan.  HLD. Continuing statin.  COPD. No evidence of exacerbation. Continue inhalers.  And nebulizer. Repeat chest x-ray for 5/18 due to tachycardia continue  Obesity Body mass index is 33.13 kg/m.  Placing the pt at higher risk of poor outcomes.   Stage 1 pressure injury left buttocks  POA Continue foam dressing.   Subjective: Uncontrolled pain.  No nausea no vomiting no fever no chills.  Abdominal pain improving.  Passing gas but no BM.  Physical Exam: In severe stress.  Crying.  Somewhat confused. No rash. S1-S2 present.  Aortic systolic murmur. Clear to auscultation. Bowel sound present.  Distended but no tenderness. Trace edema bilaterally. Alert.  No asterixis.  Data Reviewed: I have Reviewed nursing notes, Vitals, and Lab results. Since last encounter, pertinent lab results CBC and BMP   . I have ordered test including CBC and BMP  .     Disposition: Status is: Inpatient Remains inpatient appropriate because: Monitor for postop recovery  enoxaparin (LOVENOX) injection 30 mg Start: 02/18/23 1800  Family Communication: No one at bedside Level of care: Progressive continue due to RVR Vitals:   02/18/23 0551 02/18/23 0555 02/18/23 0833 02/18/23 1155  BP: 120/61  (!) 105/47 109/88  Pulse: 95  95 99  Resp: 18 20 20 20   Temp:   97.6 F (36.4 C) 98.3 F (36.8 C)  TempSrc:   Oral Oral  SpO2: 96% 95% 94% 93%  Weight:      Height:         Author: Lynden Oxford, MD 02/18/2023 5:15 PM  Please look on [email or web address] to find out who is on call.

## 2023-02-18 NOTE — Progress Notes (Addendum)
Subjective:  Co some postop  foot pain, said tolerated dialysis late last night  Objective Vital signs in last 24 hours: Vitals:   02/18/23 0551 02/18/23 0555 02/18/23 0833 02/18/23 1155  BP: 120/61  (!) 105/47 109/88  Pulse: 95  95 99  Resp: 18 20 20 20   Temp:   97.6 F (36.4 C) 98.3 F (36.8 C)  TempSrc:   Oral Oral  SpO2: 96% 95% 94% 93%  Weight:      Height:       Weight change:   Physical Exam: General: Alert chronically ill elderly female NAD Heart: RRR no MRG Lungs: CTA bilaterally, nonlabored breathing Abdomen: NABS, soft NTND Extremities: Right lower leg surgical dressing dry and clear no pedal edema dialysis Access: Left IJ TDC dressing CDI  Dialysis Orders: TTS HD in [place], [place] (Dr Leretha Dykes, [place])  3.5 hr   edw 81 kg , 3k, 2.5 Ca   Bfr 300/ DFR 600    Hep 3200 bolus then 500 q hr Epo 3800 q hd / Needs one more dose iron  for loading   Zemplar 3.5 mcg  q hd   LIJ TDC     Home meds include - asa, celexa, zetia, breo ellipta, cortef, advil, synthroid, losartan 25 hs, toprol xl 24 qd, mvi, procardia xl 60 hs, prilosec, zocor, spiriva, prns    Assessment/Plan: Fall/ R ankle fracture - s/p ORIF R ankle per ortho 5/16 Afib w/ RVR - on po diltiazem and BB ESRD - on HD TTS. On HD. Using L chest TDC.  HTN/ volume - BP's low to normal, euvolemic on exam. Small UFG next hd. IVFs recently stopped. Okay to give IV Albumin with HD today if SBP < 90. Anemia esrd - HGB 9.3  <11.7, probable postop drop /  start esa as op  and one dose iron /follow-up hgb trend  transfuse hgb under 7   MBD ckd - Ca in range, phosphorus 5.3 zemplar on hd   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 DM2 - per pmd COPD   Kayla Pastel, PA-C [facility] Beeper [id number] 02/18/2023,1:28 PM  LOS: 3 days   Pt seen, examined and agree w assess/plan as above with additions as indicated.  Rob [facility] 02/18/2023, 3:41 PM     Labs: Basic Metabolic Panel: Recent Labs   Lab 02/15/23 1804 02/17/23 0210 02/18/23 0159  NA 136 132* 133*  K 5.4* 5.3* 3.8  CL 101 94* 93*  CO2 25 24 28   GLUCOSE 81 155* 130*  BUN 21 48* 24*  CREATININE 2.36* 5.07* 3.54*  CALCIUM 8.6* 8.7* 8.2*  PHOS  --   --  5.3*   Liver Function Tests: No results for input(s): "AST", "ALT", "ALKPHOS", "BILITOT", "PROT", "ALBUMIN" in the last 168 hours. No results for input(s): "LIPASE", "AMYLASE" in the last 168 hours. No results for input(s): "AMMONIA" in the last 168 hours. CBC: Recent Labs  Lab 02/15/23 1804 02/17/23 0210 02/18/23 0159  WBC 9.0 15.9* 8.7  NEUTROABS 6.3  --   --   HGB 12.2 11.7* 9.3*  HCT 41.5 38.7 31.0*  MCV 104.3* 100.5* 101.6*  PLT 183 209 165   Cardiac Enzymes: No results for input(s): "CKTOTAL", "CKMB", "CKMBINDEX", "TROPONINI" in the last 168 hours. CBG: Recent Labs  Lab 02/16/23 2138 02/17/23 0608 02/17/23 2149 02/18/23 0625 02/18/23 1200  GLUCAP 146* 142* 142* 126* 120*    Studies/Results: DG Abd Portable 1V  Addendum Date: 02/17/2023   ADDENDUM  REPORT: 02/17/2023 23:08 ADDENDUM: These results were called by telephone at the time of interpretation on 02/17/2023 at 11:08 pm to provider Dr. Otis Dials, who verbally acknowledged these results. Electronically Signed   By: Darliss Cheney M.D.   On: 02/17/2023 23:08   Result Date: 02/17/2023 CLINICAL DATA:  Abdominal pain EXAM: PORTABLE ABDOMEN - 1 VIEW COMPARISON:  Abdominal x-ray 12/09/2022 FINDINGS: There are dilated air-filled small bowel loops measuring up to 4.2 cm in the left upper quadrant. Air seen throughout nondilated colon the level of the sigmoid. No suspicious calcifications. A single surgical clip is noted in the pelvis. There is a vascular stent in the left abdomen. Lung bases are clear. No acute fractures are identified. IMPRESSION: Dilated air-filled small bowel loops measuring up to 4.2 cm in the left upper quadrant. Findings are concerning for small bowel obstruction. Electronically  Signed: By: Darliss Cheney M.D. On: 02/17/2023 22:25   DG Ankle Complete Right  Result Date: 02/17/2023 CLINICAL DATA:  Right ankle fracture, postoperative examination EXAM: RIGHT ANKLE - COMPLETE 3+ VIEW COMPARISON:  None Available. FINDINGS: Three view radiograph of right ankle performed within an external immobilizer demonstrates surgical changes of bimalleolar ORIF with fracture fragments of the distal fibula and medial malleolus in near anatomic alignment. Posterior malleolar fracture fragment is not well visualized, however, the tibial plafond appears congruent. Normal overall alignment. No unexpected fracture or dislocation. IMPRESSION: 1. Status post bimalleolar ORIF. Fracture fragments in near anatomic alignment. Electronically Signed   By: Helyn Numbers M.D.   On: 02/17/2023 21:37   DG Ankle Complete Right  Result Date: 02/17/2023 CLINICAL DATA:  [id number] Elective surgery [id number] EXAM: RIGHT ANKLE - COMPLETE 3+ VIEW COMPARISON:  Radiograph 02/15/2023 FINDINGS: Intraoperative images during medial and lateral malleolar ORIF. Improved fracture alignment. No evidence of immediate hardware complication. IMPRESSION: Intraoperative images during ankle fracture ORIF. No evidence of immediate hardware complication. Improved fracture alignment. Electronically Signed   By: Caprice Renshaw M.D.   On: 02/17/2023 12:25   DG C-Arm 1-60 Min-No Report  Result Date: 02/17/2023 Fluoroscopy was utilized by the requesting physician.  No radiographic interpretation.   DG C-Arm 1-60 Min-No Report  Result Date: 02/17/2023 Fluoroscopy was utilized by the requesting physician.  No radiographic interpretation.   ECHOCARDIOGRAM LIMITED  Result Date: 02/17/2023    ECHOCARDIOGRAM LIMITED REPORT   Patient Name:   Kayla Vasquez Date of Exam: 02/17/2023 Medical Rec #:  [id number]    Height:       66.0 in Accession #:    [id number]   Weight:       202.8 lb Date of Birth:  December 28, 1940    BSA:          2.012 m Patient Age:    81  years     BP:           112/57 mmHg Patient Gender: F            HR:           0 bpm. Exam Location:  Inpatient Procedure: Limited Echo, Cardiac Doppler and Color Doppler Indications:    I48.91* Unspeicified atrial fibrillation  History:        Patient has no prior history of Echocardiogram examinations.                 COPD; Risk Factors:Hypertension, Diabetes and Dyslipidemia.                 ESRD.  Sonographer:    Sheralyn Boatman  RDCS Referring Phys: [id number] [facility] M PATEL  Sonographer Comments: Technically difficult study due to poor echo windows. Patient screaming with leg pain. Pre-op study. Patient with broken leg IMPRESSIONS  1. Left ventricular ejection fraction, by estimation, is 60 to 65%. The left ventricle has normal function. The left ventricle has no regional wall motion abnormalities. There is moderate concentric left ventricular hypertrophy. Left ventricular diastolic function could not be evaluated.  2. A small pericardial effusion is present. The pericardial effusion is circumferential and posterior to the left ventricle.  3. The mitral valve is degenerative. Moderate mitral annular calcification.  4. The aortic valve is calcified. Aortic valve sclerosis/calcification is present, without any evidence of aortic stenosis. Aortic valve mean gradient measures 7.0 mmHg. Aortic valve Vmax measures 1.78 m/s.  5. There is normal pulmonary artery systolic pressure. The estimated right ventricular systolic pressure is 29.4 mmHg.  6. The inferior vena cava is normal in size with greater than 50% respiratory variability, suggesting right atrial pressure of 3 mmHg. FINDINGS  Left Ventricle: Left ventricular ejection fraction, by estimation, is 60 to 65%. The left ventricle has normal function. The left ventricle has no regional wall motion abnormalities. There is moderate concentric left ventricular hypertrophy. Left ventricular diastolic function could not be evaluated. Left ventricular diastolic function could not  be evaluated due to atrial fibrillation. Right Ventricle: There is normal pulmonary artery systolic pressure. The tricuspid regurgitant velocity is 2.57 m/s, and with an assumed right atrial pressure of 3 mmHg, the estimated right ventricular systolic pressure is 29.4 mmHg. Pericardium: A small pericardial effusion is present. The pericardial effusion is circumferential and posterior to the left ventricle. Mitral Valve: The mitral valve is degenerative in appearance. There is mild thickening of the mitral valve leaflet(s). There is mild calcification of the mitral valve leaflet(s). Moderate mitral annular calcification. MV peak gradient, 14.4 mmHg. The mean mitral valve gradient is 4.0 mmHg. Tricuspid Valve: Tricuspid valve regurgitation is mild. Aortic Valve: The aortic valve is calcified. Aortic valve sclerosis/calcification is present, without any evidence of aortic stenosis. Aortic valve mean gradient measures 7.0 mmHg. Aortic valve peak gradient measures 12.6 mmHg. Venous: The inferior vena cava is normal in size with greater than 50% respiratory variability, suggesting right atrial pressure of 3 mmHg. Additional Comments: Spectral Doppler performed. Color Doppler performed.  LEFT VENTRICLE PLAX 2D LVIDd:         3.50 cm LVIDs:         2.40 cm LV PW:         1.70 cm LV IVS:        1.10 cm LVOT diam:     2.10 cm LVOT Area:     3.46 cm  LV Volumes (MOD) LV vol d, MOD A2C: 46.2 ml LV vol d, MOD A4C: 56.1 ml LV vol s, MOD A2C: 14.8 ml LV vol s, MOD A4C: 19.1 ml LV SV MOD A2C:     31.4 ml LV SV MOD A4C:     56.1 ml LV SV MOD BP:      34.7 ml IVC IVC diam: 1.90 cm AORTIC VALVE AV Vmax:      177.50 cm/s AV Vmean:     123.000 cm/s AV VTI:       0.330 m AV Peak Grad: 12.6 mmHg AV Mean Grad: 7.0 mmHg  AORTA Ao Asc diam: 3.20 cm MITRAL VALVE            TRICUSPID VALVE MV Peak grad: 14.4 mmHg TR Peak grad:  26.4 mmHg MV Mean grad: 4.0 mmHg  TR Vmax:        257.00 cm/s MV Vmax:      1.90 m/s MV Vmean:     93.7 cm/s SHUNTS                          Systemic Diam: 2.10 cm Armanda Magic MD Electronically signed by Armanda Magic MD Signature Date/Time: 02/17/2023/8:27:42 AM    Final    Medications:   Chlorhexidine Gluconate Cloth  6 each Topical Q0600   citalopram  20 mg Oral Daily   diltiazem  120 mg Oral Daily   ezetimibe  10 mg Oral Daily   fluticasone furoate-vilanterol  1 puff Inhalation Daily   hydrocortisone  20 mg Oral BID   insulin aspart  0-5 Units Subcutaneous QHS   insulin aspart  0-9 Units Subcutaneous TID WC   levothyroxine  25 mcg Oral Q0600   metoprolol succinate  25 mg Oral QHS   multivitamin with minerals  1 tablet Oral QPM   senna-docusate  1 tablet Oral BID   simethicone  80 mg Oral QID   simvastatin  40 mg Oral QPM   umeclidinium bromide  1 puff Inhalation Daily

## 2023-02-18 NOTE — Progress Notes (Signed)
BP post 500cc bolus was 104/44 with MAP 63. DR. Howerter made aware. Plan of care continues.

## 2023-02-18 NOTE — Progress Notes (Addendum)
Pt's BP was 90/50, had PM 120 mg Cardizem and 25 mg Metoprolol succinate. Recheck BP 107/61. DR Arlean Hopping notified, MD suggested to start with Cardizem Po and monitor BP.  Radiology called asking for attending's number as x-ray of abdomen showed gas and small bowel obstruction. DR. Howerter made aware. Also notified MD that pt had episode of green vomitus x1 early morning before surgery but no complain of abdominal pain or nausea after that. Bowel sounds present, abdomen little distended. Per patient is she not passing gas .MD talked to radiology , pt was made NPO per order. Plan of care continues.

## 2023-02-18 NOTE — Progress Notes (Signed)
Pt BP 83/68 with MAP 75, 81/51 (62), 86/54 (60), 78/57 (64) , on Call MD Dr. Arlean Hopping notified. Also notified MD that Cardizem was administered at 2318. Pt was crying in pain so 1mg  Dilaudid was administered after midnight when BP was 110/55. Received order for 500 cc bolus and dilaudid dose was changed per MD. Plan of acre continues.

## 2023-02-18 NOTE — Progress Notes (Addendum)
[facility] night cross cover note:   I was notified by RN that this patient who is here with acute right ankle fracture status post ORIF of right ankle on 02/17/2023 he is in atrial fibrillation with heart rates in the 1 teens.  Corresponding systolic blood pressures in the low 100s.   Per my chart review, she has a history of paroxysmal atrial fibrillation, including a history of intermittent RVR.  Leading up to her ORIF of the right ankle, she was on diltiazem drip, with postoperative plan to resume outpatient Cardizem as well as metoprolol.  She has been off of the diltiazem drip since the morning of 02/17/2023, and is not yet received her Cardizem or metoprolol during this current hospitalization.  Denies any current chest pain or shortness of breath.  Afebrile.  She does however have some pain associated with her right ankle.  Given that the patient is currently due for both her Cardizem and metoprolol, I asked that the RN only give the Cardizem given borderline soft blood pressures, with the opportunity to evaluate interval trend in heart rate as well as blood pressure and response to this initial AV nodal blocker.  Patient received her home Cardizem around 2315.  Around this time, she also received a dose 1 mg of IV Dilaudid per existing order for Dilaudid 1 mg IV every 3 hours as needed.  Ensuing heart rates improved into the low 100s, although systolic blood pressures also decreased following administration of aforementioned Cardizem and Dilaudid, with systolic blood pressures subsequently into the 80s mmHg.   It is noted that she has end-stage renal disease on hemodialysis, and that she underwent a complete hemodialysis session on 02/17/2023 after undergoing aforementioned ORIF of the right ankle.  In the setting of hypotension following administration of IV opioids, differential includes potential intravascular depletion.  Consequently, I ordered a small IV fluid bolus in the form of normal saline at 500  cc to be administered over 2 hours, with plan for close monitoring of ensuing vital signs, including blood pressure trend as well as evaluation for any interval development of acute volume overload.  It is noted that her next scheduled hemodialysis session will occur on Saturday, per her existing hemodialysis schedule of Tuesday, Thursday, Saturday.  I also decreased her prn IV Dilaudid to 0.5 mg IV every 3 hours as needed.  Additionally, I was contacted by on-call readings were radiology, who noted that the patient's abdominal plain film with suggestive of ileus versus small bowel obstruction.  Per my discussions with the RN, the patient is not complaining of any abdominal pain nor is she experiencing any nausea/vomiting.  Additionally, she continues to have active bowel sounds.  As a result of this imaging, I have made the patient n.p.o. except for sips with meds.  She is not experiencing any nausea, vomiting, or abdominal discomfort, there does not appear to be an indication at this time to initiate NG tube.     Newton Pigg, DO Hospitalist

## 2023-02-19 ENCOUNTER — Inpatient Hospital Stay (HOSPITAL_COMMUNITY): Payer: Medicare Other

## 2023-02-19 DIAGNOSIS — S82851A Displaced trimalleolar fracture of right lower leg, initial encounter for closed fracture: Secondary | ICD-10-CM | POA: Diagnosis not present

## 2023-02-19 LAB — BASIC METABOLIC PANEL
Anion gap: 14 (ref 5–15)
BUN: 42 mg/dL — ABNORMAL HIGH (ref 8–23)
CO2: 24 mmol/L (ref 22–32)
Calcium: 8.5 mg/dL — ABNORMAL LOW (ref 8.9–10.3)
Chloride: 95 mmol/L — ABNORMAL LOW (ref 98–111)
Creatinine, Ser: 5.24 mg/dL — ABNORMAL HIGH (ref 0.44–1.00)
GFR, Estimated: 8 mL/min — ABNORMAL LOW (ref >60)
Glucose, Bld: 119 mg/dL — ABNORMAL HIGH (ref 70–99)
Potassium: 4.4 mmol/L (ref 3.5–5.1)
Sodium: 133 mmol/L — ABNORMAL LOW (ref 135–145)

## 2023-02-19 LAB — GLUCOSE, CAPILLARY
Glucose-Capillary: 106 mg/dL — ABNORMAL HIGH (ref 70–99)
Glucose-Capillary: 101 mg/dL — ABNORMAL HIGH (ref 70–99)
Glucose-Capillary: 98 mg/dL (ref 70–99)

## 2023-02-19 LAB — CBC
HCT: 31.3 % — ABNORMAL LOW (ref 36.0–46.0)
Hemoglobin: 9.4 g/dL — ABNORMAL LOW (ref 12.0–15.0)
MCH: 30.8 pg (ref 26.0–34.0)
MCHC: 30 g/dL (ref 30.0–36.0)
MCV: 102.6 fL — ABNORMAL HIGH (ref 80.0–100.0)
Platelets: 183 10*3/uL (ref 150–400)
RBC: 3.05 MIL/uL — ABNORMAL LOW (ref 3.87–5.11)
RDW: 17.2 % — ABNORMAL HIGH (ref 11.5–15.5)
WBC: 10.6 10*3/uL — ABNORMAL HIGH (ref 4.0–10.5)
nRBC: 0 % (ref 0.0–0.2)

## 2023-02-19 LAB — MAGNESIUM: Magnesium: 1.9 mg/dL (ref 1.7–2.4)

## 2023-02-19 MED ORDER — SODIUM CHLORIDE 0.9 % IV SOLN
200.0000 mg | Freq: Once | INTRAVENOUS | Status: AC
  Administered 2023-02-19: 200 mg via INTRAVENOUS
  Filled 2023-02-19: qty 10

## 2023-02-19 MED ORDER — ALTEPLASE 2 MG IJ SOLR: 2.0000 mg | Freq: Once | INTRAMUSCULAR | Status: DC

## 2023-02-19 MED ORDER — SENNOSIDES-DOCUSATE SODIUM 8.6-50 MG PO TABS
2.0000 | ORAL_TABLET | Freq: Two times a day (BID) | ORAL | Status: DC
  Administered 2023-02-19 – 2023-02-24 (×11): 2 via ORAL
  Filled 2023-02-19 (×11): qty 2

## 2023-02-19 MED ORDER — HEPARIN SODIUM (PORCINE) 1000 UNIT/ML IJ SOLN
INTRAMUSCULAR | Status: AC
  Filled 2023-02-19: qty 4

## 2023-02-19 MED ORDER — CITALOPRAM HYDROBROMIDE 20 MG PO TABS
20.0000 mg | ORAL_TABLET | Freq: Every day | ORAL | Status: AC
  Administered 2023-02-19: 20 mg via ORAL
  Filled 2023-02-19: qty 1

## 2023-02-19 MED ORDER — ALTEPLASE 2 MG IJ SOLR: 4.0000 mg | Freq: Once | INTRAMUSCULAR | Status: DC

## 2023-02-19 MED ORDER — ALTEPLASE 2 MG IJ SOLR
INTRAMUSCULAR | Status: AC
  Filled 2023-02-19: qty 2

## 2023-02-19 MED ORDER — HEPARIN SODIUM (PORCINE) 5000 UNIT/ML IJ SOLN
5000.0000 [IU] | Freq: Three times a day (TID) | INTRAMUSCULAR | Status: DC
  Administered 2023-02-19 – 2023-02-21 (×2): 5000 [IU] via SUBCUTANEOUS
  Filled 2023-02-19 (×4): qty 1

## 2023-02-19 MED ORDER — POLYETHYLENE GLYCOL 3350 17 G PO PACK
17.0000 g | PACK | Freq: Two times a day (BID) | ORAL | Status: DC
  Administered 2023-02-19 – 2023-02-20 (×4): 17 g via ORAL
  Filled 2023-02-19 (×5): qty 1

## 2023-02-19 MED ORDER — SENNOSIDES-DOCUSATE SODIUM 8.6-50 MG PO TABS: 2.0000 | ORAL_TABLET | Freq: Two times a day (BID) | ORAL | Status: DC

## 2023-02-19 MED ORDER — CITALOPRAM HYDROBROMIDE 20 MG PO TABS
40.0000 mg | ORAL_TABLET | Freq: Every day | ORAL | Status: DC
  Administered 2023-02-20 – 2023-02-24 (×5): 40 mg via ORAL
  Filled 2023-02-19 (×2): qty 2
  Filled 2023-02-19: qty 1
  Filled 2023-02-19 (×3): qty 2

## 2023-02-19 MED ORDER — ALTEPLASE 2 MG IJ SOLR
INTRAMUSCULAR | Status: AC
  Filled 2023-02-19: qty 4

## 2023-02-19 NOTE — Plan of Care (Signed)
  Problem: Education: Goal: Knowledge of General Education information will improve Description: Including pain rating scale, medication(s)/side effects and non-pharmacologic comfort measures Outcome: Progressing   Problem: Health Behavior/Discharge Planning: Goal: Ability to manage health-related needs will improve Outcome: Progressing   Problem: Clinical Measurements: Goal: Ability to maintain clinical measurements within normal limits will improve Outcome: Progressing Goal: Will remain free from infection Outcome: Progressing Goal: Respiratory complications will improve Outcome: Progressing   Problem: Activity: Goal: Risk for activity intolerance will decrease Outcome: Progressing   Problem: Pain Managment: Goal: General experience of comfort will improve Outcome: Progressing   Problem: Safety: Goal: Ability to remain free from injury will improve Outcome: Progressing   Problem: Skin Integrity: Goal: Risk for impaired skin integrity will decrease Outcome: Progressing   

## 2023-02-19 NOTE — Progress Notes (Addendum)
Received patient in bed.Alert ,awake and oriented to self,place.  Access used : Left Hd cathet that worked well.Dressing on date.  Duration of treatment: 53 minutes  Fluid removed. -1.1  Hemo comment:From the start of the treatment ,arterial catheter has very sluggish blood returned ,reversed connection done during treatment,but after less than an hour ,both catheters were not working well.Two hours of alta-plase dwelling done as ordered,tried to re-start the treatment,zero blood return from both catheters.Renal MD on call made aware.Alta-plase overnight dwelling ordered.Done and carried out.  Hand off to the patient's nurse.

## 2023-02-19 NOTE — Progress Notes (Signed)
   02/19/23 1943  Vitals  Temp 97.6 F (36.4 C)  Temp Source Oral  BP (!) 133/94  MAP (mmHg) 107  BP Location Left Wrist  BP Method Automatic  Patient Position (if appropriate) Lying  Pulse Rate (!) 35  Pulse Rate Source Monitor  ECG Heart Rate 100  Resp 19  Level of Consciousness  Level of Consciousness Alert  MEWS COLOR  MEWS Score Color Green  Oxygen Therapy  SpO2 100 %  O2 Device Nasal Cannula  O2 Flow Rate (L/min) 2 L/min  MEWS Score  MEWS Temp 0  MEWS Systolic 0  MEWS Pulse 0  MEWS RR 0  MEWS LOC 0  MEWS Score 0   Pt arrived to room MC4E07 from dialysis. Pt oriented to unit. Pt back on tele. Pt made comfortable. Family at bedside.

## 2023-02-19 NOTE — [place] FL2 (Signed)
[place] MEDICAID FL2 LEVEL OF CARE FORM     IDENTIFICATION  Patient Name: Kayla Vasquez Birthdate: 1941/03/29 Sex: female Admission Date (Current Location): 02/15/2023  [facility] and [place] Number:  Producer, television/film/video and Address:  [facility], [address], [place], [place] [address]      Provider Number: [id number]  Attending Physician Name and Address:  Rolly Salter, MD  Relative Name and Phone Number:  Oswaldo Milian [phone]    Current Level of Care: Hospital Recommended Level of Care: Skilled Nursing Facility Prior Approval Number:    Date Approved/Denied:   PASRR Number: [id number]  Discharge Plan: SNF    Current Diagnoses: Patient Active Problem List   Diagnosis Date Noted   End-stage renal disease on hemodialysis (HCC) 02/16/2023   Essential hypertension 02/16/2023   Hypothyroidism 02/16/2023   COPD without exacerbation (HCC) 02/16/2023   Dyslipidemia 02/16/2023   Adrenal insufficiency (HCC) 02/16/2023   Type 2 diabetes mellitus with chronic kidney disease, without long-term current use of insulin (HCC) 02/16/2023   ESRD on hemodialysis (HCC) 02/16/2023   Trimalleolar fracture of ankle, closed, right, initial encounter 02/16/2023   Unable to ambulate 02/15/2023   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   History of breast cancer 08/22/2018   Hyperlipidemia 08/22/2018   Adrenal crisis (HCC) 08/21/2018    Orientation RESPIRATION BLADDER Height & Weight     Self, Time, Situation, Place  O2 ([place] 2L) Incontinent Weight: 205 lb 4 oz (93.1 kg) Height:  5\' 6"  (167.6 cm)  BEHAVIORAL SYMPTOMS/MOOD NEUROLOGICAL BOWEL NUTRITION STATUS      Continent Diet (See dc summary)  AMBULATORY STATUS COMMUNICATION OF NEEDS Skin   Extensive Assist Verbally Surgical wounds, Other (Comment) (Pressure Injury 02/17/23-buttocks, left deep tissue. Surgical Insicion Closed Right Leg 02/17/23)                       Personal Care Assistance  Level of Assistance  Bathing, Feeding, Dressing Bathing Assistance: Limited assistance Feeding assistance: Independent Dressing Assistance: Limited assistance     Functional Limitations Info  Sight, Hearing, Speech Sight Info: Adequate Hearing Info: Impaired Speech Info: Adequate    SPECIAL CARE FACTORS FREQUENCY  PT (By licensed PT), OT (By licensed OT)     PT Frequency: 5x week OT Frequency: 5x week            Contractures Contractures Info: Not present    Additional Factors Info  Code Status, Allergies, Insulin Sliding Scale Code Status Info: Full Allergies Info: Amoxicillin-pot Clavulanate  Codeine  Venlafaxine   Insulin Sliding Scale Info: see dc summary       Current Medications (02/19/2023):  This is the current hospital active medication list Current Facility-Administered Medications  Medication Dose Route Frequency Provider Last Rate Last Admin   acetaminophen (TYLENOL) tablet 650 mg  650 mg Oral Q6H PRN Montez Morita, PA-C       Or   acetaminophen (TYLENOL) suppository 650 mg  650 mg Rectal Q6H PRN Montez Morita, PA-C       acetaminophen (TYLENOL) tablet 1,000 mg  1,000 mg Oral TID Rolly Salter, MD   1,000 mg at 02/19/23 1027   Chlorhexidine Gluconate Cloth 2 % PADS 6 each  6 each Topical Q0600 Montez Morita, PA-C   6 each at 02/19/23 0517   citalopram (CELEXA) tablet 20 mg  20 mg Oral Daily Montez Morita, PA-C   20 mg at 02/19/23 1027   Darbepoetin Alfa (ARANESP)  injection 25 mcg  25 mcg Subcutaneous Q Sat-1800 [name], PA-C       diltiazem (CARDIZEM CD) 24 hr capsule 120 mg  120 mg Oral Daily Rolly Salter, MD   120 mg at 02/19/23 1027   enoxaparin (LOVENOX) injection 30 mg  30 mg Subcutaneous Q24H Leander Rams, RPH       fluticasone furoate-vilanterol (BREO ELLIPTA) 200-25 MCG/ACT 1 puff  1 puff Inhalation Daily Montez Morita, PA-C   1 puff at 02/19/23 1029   hydrocortisone (CORTEF) tablet 20 mg  20 mg Oral BID Rolly Salter, MD   20 mg at 02/19/23 1027    HYDROmorphone (DILAUDID) injection 0.5 mg  0.5 mg Intravenous Q2H PRN Rolly Salter, MD   0.5 mg at 02/18/23 1603   insulin aspart (novoLOG) injection 0-5 Units  0-5 Units Subcutaneous QHS Montez Morita, PA-C       insulin aspart (novoLOG) injection 0-9 Units  0-9 Units Subcutaneous TID WC Montez Morita, PA-C   1 Units at 02/17/23 1610   iron dextran complex (INFED) injection 100 mg  100 mg Intravenous Once Zeyfang, David, PA-C       levalbuterol [facility]) nebulizer solution 0.63 mg  0.63 mg Nebulization Q6H PRN Montez Morita, PA-C       levothyroxine (SYNTHROID) tablet 25 mcg  25 mcg Oral Q0600 Montez Morita, PA-C   25 mcg at 02/19/23 0516   metoprolol succinate (TOPROL-XL) 24 hr tablet 25 mg  25 mg Oral QHS Montez Morita, PA-C   25 mg at 02/18/23 2144   multivitamin with minerals tablet 1 tablet  1 tablet Oral QPM Montez Morita, PA-C   1 tablet at 02/16/23 1715   ondansetron (ZOFRAN) tablet 4 mg  4 mg Oral Q6H PRN Montez Morita, PA-C       Or   ondansetron [facility]) injection 4 mg  4 mg Intravenous Q6H PRN Montez Morita, PA-C   4 mg at 02/17/23 0413   oxyCODONE (Oxy IR/ROXICODONE) immediate release tablet 10 mg  10 mg Oral Q4H PRN Rolly Salter, MD   10 mg at 02/18/23 2144   paricalcitol (ZEMPLAR) injection 3.5 mcg  3.5 mcg Intravenous Once per day on Mon Wed Fri Zeyfang, Onalee Hua, PA-C       senna-docusate (Senokot-S) tablet 2 tablet  2 tablet Oral BID Rolly Salter, MD   2 tablet at 02/19/23 1027   simethicone (MYLICON) chewable tablet 80 mg  80 mg Oral QID Rolly Salter, MD   80 mg at 02/19/23 1027   sorbitol, milk of mag, mineral oil, glycerin (SMOG) enema  960 mL Rectal Daily PRN Rolly Salter, MD       traZODone (DESYREL) tablet 25 mg  25 mg Oral QHS PRN Montez Morita, PA-C       umeclidinium bromide (INCRUSE ELLIPTA) 62.5 MCG/ACT 1 puff  1 puff Inhalation Daily Montez Morita, PA-C   1 puff at 02/19/23 1028     Discharge Medications: Please see discharge summary for a list of discharge  medications.  Relevant Imaging Results:  Relevant Lab Results:   Additional Information Pt receives out-pt HD at [facility] on TTS, may need to switch facilities for a SNF in [place]. SSN [phone]  Carmina Miller, LCSWA

## 2023-02-19 NOTE — TOC Initial Note (Signed)
Transition of Care [facility]) - Initial/Assessment Note    Patient Details  Name: Kayla Vasquez MRN: [id number] Date of Birth: [birth year]  [facility] CM/SW Contact:    Carmina Miller, LCSWA Phone Number: 02/19/2023, 10:28 AM  Clinical Narrative:                  CSW spoke with pt's daughter Melody in reference to pt's recommendation of SNF, she is agreeable and requests placement near her home in [place], first choice is [facility]. CSW discussed Medicare.gov and insurance auth process. All questions answered.         Patient Goals and CMS Choice            Expected Discharge Plan and Services                                              Prior Living Arrangements/Services                       Activities of Daily Living      Permission Sought/Granted                  Emotional Assessment              Admission diagnosis:  ESRD on hemodialysis (HCC) [N18.6, Z99.2] Closed trimalleolar fracture of right ankle, initial encounter [S82.851A] Hematoma of arm, left, initial encounter [S40.022A] Unable to ambulate [R26.2] Hematoma of right wrist [S60.211A] Patient Active Problem List   Diagnosis Date Noted   End-stage renal disease on hemodialysis (HCC) 02/16/2023   Essential hypertension 02/16/2023   Hypothyroidism 02/16/2023   COPD without exacerbation (HCC) 02/16/2023   Dyslipidemia 02/16/2023   Adrenal insufficiency (HCC) 02/16/2023   Type 2 diabetes mellitus with chronic kidney disease, without long-term current use of insulin (HCC) 02/16/2023   ESRD on hemodialysis (HCC) 02/16/2023   Trimalleolar fracture of ankle, closed, right, initial encounter 02/16/2023   Unable to ambulate 02/15/2023   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   History of breast cancer 08/22/2018   Hyperlipidemia 08/22/2018   Adrenal crisis (HCC) 08/21/2018   PCP:  [facility]:   [facility] [address], [place] - [address] [place] [address] Phone: [phone] Fax: [phone]  [facility] - [place], [place] - [address] [place] [address] Phone: [phone] Fax: [phone]     Social Determinants of Health (SDOH) Social History: SDOH Screenings   Tobacco Use: Low Risk  (02/17/2023)   SDOH Interventions:     Readmission Risk Interventions     No data to display

## 2023-02-19 NOTE — Progress Notes (Signed)
[facility] Progress Note Patient: Mattilyn Huenink QIO:[id number] DOB: 04/21/1941 DOA: 02/15/2023  DOS: the patient was seen and examined on 02/19/2023  Brief hospital course: PMH of ESRD on HD TTS, HLD, breast cancer, paroxysmal A-fib, GI bleed not on any anticoagulation, nonobstructive CAD, hypothyroidism, adrenal insufficiency, COPD presented to the hospital with complaints of mechanical fall. She was getting into the transport vehicle after her HD and missed a step and fell down injuring her right ankle, right wrist and left arm.  Found to have trimalleolar fracture.  Developed A-fib with RVR on 5/15 requiring Cardizem drip. Having significant issues with pain control.  Developing delirium most likely secondary to psychotropic medications as well as postanesthesia effect. Assessment and Plan: Trimalleolar right ankle fracture closed. After mechanical fall. Orthopedic consulted. Underwent ORIF of the ankle fracture and manual application of stress ankle syndesmosis. Continues to report severe pain.  But has not received any pain medication today despite her assertions that she received some pain medication earlier suggesting delirium.  Continues to refuse ice pack.  Also refusing leg elevation. Appears to also have developed delirium postoperatively. Postop DVT Px, Weight bearing status, wound management and pain control per Orthopedics.  Preoperative cardiac evaluation Tolerated surgery without any cardiac issues.  Paroxysmal A-fib with RVR. Known history of paroxysmal A-fib with RVR preoperatively secondary to pain. Initiated on Cardizem drip. Hold nifedipine.  Now on oral Cardizem. Continue metoprolol. Limited echocardiogram reassuring with normal EF Not on anticoagulation due to prior history of GI bleed.  ESRD HD TTS. Nephrology consulted. Continue HD per schedule.  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 Acute.  Blood loss anemia Baseline  hemoglobin around 12.  Dropped down to 9.3 postoperatively. No active bleeding seen. Will monitor for now.  Nonobstructive CAD. Stress test in 2019 was negative for any acute abnormality. Echocardiogram recently showed EF of 60-65%. Repeated this admission and EF remains same Echocardiogram showed no wall motion abnormality or significant valvular abnormality. Monitor for now.  Postop ileus.  Resolved. Developed abdominal pain on 5/16.  X-ray abdomen shows evidence of possible ileus as well as constipation. Currently initiated on bowel regimen. Given that the patient does not have any nausea or vomiting and abdominal pain also appears to have resolved will advance to clear liquid diet. Repeat x-ray abdomen from 5/18 shows improvement.  Adrenal insufficiency. Patient is on Cortef 5 mg twice daily.  Did not receive any stress dose pre or intraoperatively. Postoperatively received Solu-Cortef 50 mg on 5/16.  Will increase oral Solu-Cortef to 20 mg twice daily for now.  Hypothyroidism. Continue Synthroid.  Mood disorder. Continue Celexa.  HTN. Holding losartan.  HLD. Continuing statin.  COPD. No evidence of exacerbation. Continue inhalers.  And nebulizer. Repeat chest x-ray for 5/18 shows no acute abnormality.  Obesity Body mass index is 29.75 kg/m.  Placing the pt at higher risk of poor outcomes.   Stage 1 pressure injury left buttocks  POA Continue foam dressing.   Subjective: Continues to report uncontrolled pain.  Somewhat confused.  No nausea no vomiting.  No fever no chills.  Physical Exam: Somewhat agitated. No rash. S1-S2 present. Appears to have a respiratory distress but no wheezing or crackling heard on examination. Bowel sound present.  Nontender. Trace edema bilaterally.  Data Reviewed: I have Reviewed nursing notes, Vitals, and Lab results. Reviewed CBC and BMP. Reviewed her CBC and BMP.    Disposition: Status is: Inpatient Remains inpatient  appropriate because: Monitor for postop recovery  enoxaparin (LOVENOX) injection 30 mg Start: 02/18/23 1800   Family Communication: No one at bedside Level of care: Progressive continue due to RVR Vitals:   02/19/23 1458 02/19/23 1530 02/19/23 1603 02/19/23 1632  BP: 122/71 121/70 127/69 (!) 141/60  Pulse: 85 83 89 92  Resp: 20 (!) 21 17 19   Temp:      TempSrc:      SpO2: 99% 98% 98% 100%  Weight:      Height:         Author: Lynden Oxford, MD 02/19/2023 5:07 PM  Please look on [email or web address] to find out who is on call.

## 2023-02-19 NOTE — Progress Notes (Signed)
Subjective: For dialysis today on schedule, postop ankle pain stable  Objective Vital signs in last 24 hours: Vitals:   02/19/23 0312 02/19/23 0857 02/19/23 0901 02/19/23 1141  BP: 117/67  (!) 99/51 (!) 141/66  Pulse: 82  96 (!) 106  Resp: 18  20 16   Temp: 97.8 F (36.6 C)  97.6 F (36.4 C) 98 F (36.7 C)  TempSrc: Oral Oral Oral Axillary  SpO2: 97%  97% 96%  Weight:      Height:       Weight change:   Physical Exam: General: Alert chronically ill elderly female NAD Heart: RRR no MRG Lungs: CTA bilaterally, nonlabored breathing Abdomen: NABS, soft NTND Extremities: Right lower leg surgical dressing dry and clear trace bipedal  edema dialysis Access: Left IJ TDC dressing CDI   Dialysis Orders: TTS HD in [place], [place] (Dr Kayla Vasquez, [place])  3.5 hr   edw 81 kg , 3k, 2.5 Ca   Bfr 300/ DFR 600    Hep 3200 bolus then 500 q hr Epo 3800 q hd / Needs one more dose iron  for loading (Venofer 200 mg per OP HD  RN)  Zemplar 3.5 mcg  q hd   LIJ TDC     Home meds include - asa, celexa, zetia, breo ellipta, cortef, advil, synthroid, losartan 25 hs, toprol xl 24 qd, mvi, procardia xl 60 hs, prilosec, zocor, spiriva, prns    Assessment/Plan: Fall/ R ankle fracture - s/p ORIF R ankle per ortho 5/16 Afib w/ RVR - on po diltiazem and BB ESRD - on HD TTS. On HD. Using L chest TDC.  HTN/ volume - BP's low to normal, euvolemic on exam. Small UFG next hd. IVFs recently stopped. Okay to give IV Albumin with HD if SBP < 90. Anemia esrd - HGB 9.4  <11.7, probable postop drop /  start esa as op  and one dose iron /follow-up hgb trend  transfuse hgb under 7   MBD ckd - Ca in range, phosphorus 5.3 zemplar on hd   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 DM2 - per pmd COPD-  Kayla Pastel, PA-C [facility] [phone] 02/19/2023,11:58 AM  LOS: 4 days   Labs: Basic Metabolic Panel: Recent Labs  Lab 02/17/23 0210 02/18/23 0159 02/19/23 0135  NA 132* 133* 133*  K 5.3* 3.8 4.4  CL 94*  93* 95*  CO2 24 28 24   GLUCOSE 155* 130* 119*  BUN 48* 24* 42*  CREATININE 5.07* 3.54* 5.24*  CALCIUM 8.7* 8.2* 8.5*  PHOS  --  5.3*  --    Liver Function Tests: No results for input(s): "AST", "ALT", "ALKPHOS", "BILITOT", "PROT", "ALBUMIN" in the last 168 hours. No results for input(s): "LIPASE", "AMYLASE" in the last 168 hours. No results for input(s): "AMMONIA" in the last 168 hours. CBC: Recent Labs  Lab 02/15/23 1804 02/17/23 0210 02/18/23 0159 02/19/23 0135  WBC 9.0 15.9* 8.7 10.6*  NEUTROABS 6.3  --   --   --   HGB 12.2 11.7* 9.3* 9.4*  HCT 41.5 38.7 31.0* 31.3*  MCV 104.3* 100.5* 101.6* 102.6*  PLT 183 209 165 183   Cardiac Enzymes: No results for input(s): "CKTOTAL", "CKMB", "CKMBINDEX", "TROPONINI" in the last 168 hours. CBG: Recent Labs  Lab 02/18/23 1200 02/18/23 1637 02/18/23 2121 02/19/23 0603 02/19/23 1144  GLUCAP 120* 104* 122* 106* 101*    Studies/Results: DG CHEST PORT 1 VIEW  Result Date: 02/19/2023 CLINICAL DATA:  Shortness of breath. EXAM: PORTABLE CHEST 1  VIEW COMPARISON:  One-view chest x-ray 02/15/2023 FINDINGS: Left IJ dialysis catheter is stable. Heart size upper limits of normal. Lung volumes have decreased some. No focal airspace disease is present. Visualized soft tissues and bony thorax. IMPRESSION: 1. Low lung volumes. 2. No acute cardiopulmonary disease. Electronically Signed   By: Marin Roberts M.D.   On: 02/19/2023 09:40   DG Abd Portable 1V  Result Date: 02/19/2023 CLINICAL DATA:  Shortness of breath.  Ileus. EXAM: PORTABLE ABDOMEN - 1 VIEW COMPARISON:  02/17/2023 FINDINGS: Contrast is identified within the ascending and transverse colon. There has been improvement in dilatation of small bowel loops in the LEFT UPPER QUADRANT. A few LEFT mid abdominal bowel loops remain mildly enlarged, measuring up to 3.1 centimeters. There is no evidence for free intraperitoneal air. Visualized osseous structures have a normal appearance.  IMPRESSION: 1. Interval improvement in dilatation of small bowel loops in the LEFT UPPER QUADRANT. 2. Contrast is identified within the ascending and transverse colon. Electronically Signed   By: Norva Pavlov M.D.   On: 02/19/2023 09:31   DG Abd Portable 1V  Addendum Date: 02/17/2023   ADDENDUM REPORT: 02/17/2023 23:08 ADDENDUM: These results were called by telephone at the time of interpretation on 02/17/2023 at 11:08 pm to provider Dr. Otis Dials, who verbally acknowledged these results. Electronically Signed   By: Darliss Cheney M.D.   On: 02/17/2023 23:08   Result Date: 02/17/2023 CLINICAL DATA:  Abdominal pain EXAM: PORTABLE ABDOMEN - 1 VIEW COMPARISON:  Abdominal x-ray 12/09/2022 FINDINGS: There are dilated air-filled small bowel loops measuring up to 4.2 cm in the left upper quadrant. Air seen throughout nondilated colon the level of the sigmoid. No suspicious calcifications. A single surgical clip is noted in the pelvis. There is a vascular stent in the left abdomen. Lung bases are clear. No acute fractures are identified. IMPRESSION: Dilated air-filled small bowel loops measuring up to 4.2 cm in the left upper quadrant. Findings are concerning for small bowel obstruction. Electronically Signed: By: Darliss Cheney M.D. On: 02/17/2023 22:25   DG Ankle Complete Right  Result Date: 02/17/2023 CLINICAL DATA:  Right ankle fracture, postoperative examination EXAM: RIGHT ANKLE - COMPLETE 3+ VIEW COMPARISON:  None Available. FINDINGS: Three view radiograph of right ankle performed within an external immobilizer demonstrates surgical changes of bimalleolar ORIF with fracture fragments of the distal fibula and medial malleolus in near anatomic alignment. Posterior malleolar fracture fragment is not well visualized, however, the tibial plafond appears congruent. Normal overall alignment. No unexpected fracture or dislocation. IMPRESSION: 1. Status post bimalleolar ORIF. Fracture fragments in near anatomic  alignment. Electronically Signed   By: Helyn Numbers M.D.   On: 02/17/2023 21:37   DG Ankle Complete Right  Result Date: 02/17/2023 CLINICAL DATA:  [id number] Elective surgery [id number] EXAM: RIGHT ANKLE - COMPLETE 3+ VIEW COMPARISON:  Radiograph 02/15/2023 FINDINGS: Intraoperative images during medial and lateral malleolar ORIF. Improved fracture alignment. No evidence of immediate hardware complication. IMPRESSION: Intraoperative images during ankle fracture ORIF. No evidence of immediate hardware complication. Improved fracture alignment. Electronically Signed   By: Caprice Renshaw M.D.   On: 02/17/2023 12:25   DG C-Arm 1-60 Min-No Report  Result Date: 02/17/2023 Fluoroscopy was utilized by the requesting physician.  No radiographic interpretation.   DG C-Arm 1-60 Min-No Report  Result Date: 02/17/2023 Fluoroscopy was utilized by the requesting physician.  No radiographic interpretation.   Medications:  iron sucrose      acetaminophen  1,000 mg Oral TID  Chlorhexidine Gluconate Cloth  6 each Topical Q0600   citalopram  20 mg Oral Daily   darbepoetin (ARANESP) injection - DIALYSIS  25 mcg Subcutaneous Q Sat-1800   diltiazem  120 mg Oral Daily   enoxaparin (LOVENOX) injection  30 mg Subcutaneous Q24H   fluticasone furoate-vilanterol  1 puff Inhalation Daily   hydrocortisone  20 mg Oral BID   insulin aspart  0-5 Units Subcutaneous QHS   insulin aspart  0-9 Units Subcutaneous TID WC   levothyroxine  25 mcg Oral Q0600   metoprolol succinate  25 mg Oral QHS   multivitamin with minerals  1 tablet Oral QPM   paricalcitol  3.5 mcg Intravenous Once per day on Mon Wed Fri   polyethylene glycol  17 g Oral BID   senna-docusate  2 tablet Oral BID   simethicone  80 mg Oral QID   umeclidinium bromide  1 puff Inhalation Daily

## 2023-02-20 DIAGNOSIS — S82851A Displaced trimalleolar fracture of right lower leg, initial encounter for closed fracture: Secondary | ICD-10-CM | POA: Diagnosis not present

## 2023-02-20 LAB — T4, FREE: Free T4: 0.84 ng/dL (ref 0.61–1.12)

## 2023-02-20 LAB — COMPREHENSIVE METABOLIC PANEL
ALT: 5 U/L (ref 0–44)
AST: 28 U/L (ref 15–41)
Albumin: 2.9 g/dL — ABNORMAL LOW (ref 3.5–5.0)
Alkaline Phosphatase: 60 U/L (ref 38–126)
Anion gap: 14 (ref 5–15)
BUN: 51 mg/dL — ABNORMAL HIGH (ref 8–23)
CO2: 23 mmol/L (ref 22–32)
Calcium: 8.7 mg/dL — ABNORMAL LOW (ref 8.9–10.3)
Chloride: 97 mmol/L — ABNORMAL LOW (ref 98–111)
Creatinine, Ser: 6 mg/dL — ABNORMAL HIGH (ref 0.44–1.00)
GFR, Estimated: 7 mL/min — ABNORMAL LOW (ref >60)
Glucose, Bld: 111 mg/dL — ABNORMAL HIGH (ref 70–99)
Potassium: 4.4 mmol/L (ref 3.5–5.1)
Sodium: 134 mmol/L — ABNORMAL LOW (ref 135–145)
Total Bilirubin: 0.5 mg/dL (ref 0.3–1.2)
Total Protein: 6 g/dL — ABNORMAL LOW (ref 6.5–8.1)

## 2023-02-20 LAB — CBC WITH DIFFERENTIAL/PLATELET
Abs Immature Granulocytes: 0.08 10*3/uL — ABNORMAL HIGH (ref 0.00–0.07)
Basophils Absolute: 0.1 10*3/uL (ref 0.0–0.1)
Basophils Relative: 0 %
Eosinophils Absolute: 0.2 10*3/uL (ref 0.0–0.5)
Eosinophils Relative: 2 %
HCT: 33.2 % — ABNORMAL LOW (ref 36.0–46.0)
Hemoglobin: 9.7 g/dL — ABNORMAL LOW (ref 12.0–15.0)
Immature Granulocytes: 1 %
Lymphocytes Relative: 12 %
Lymphs Abs: 1.4 10*3/uL (ref 0.7–4.0)
MCH: 30.2 pg (ref 26.0–34.0)
MCHC: 29.2 g/dL — ABNORMAL LOW (ref 30.0–36.0)
MCV: 103.4 fL — ABNORMAL HIGH (ref 80.0–100.0)
Monocytes Absolute: 1.2 10*3/uL — ABNORMAL HIGH (ref 0.1–1.0)
Monocytes Relative: 10 %
Neutro Abs: 9.1 10*3/uL — ABNORMAL HIGH (ref 1.7–7.7)
Neutrophils Relative %: 75 %
Platelets: 225 10*3/uL (ref 150–400)
RBC: 3.21 MIL/uL — ABNORMAL LOW (ref 3.87–5.11)
RDW: 16.7 % — ABNORMAL HIGH (ref 11.5–15.5)
WBC: 12 10*3/uL — ABNORMAL HIGH (ref 4.0–10.5)
nRBC: 0 % (ref 0.0–0.2)

## 2023-02-20 LAB — GLUCOSE, CAPILLARY
Glucose-Capillary: 93 mg/dL (ref 70–99)
Glucose-Capillary: 109 mg/dL — ABNORMAL HIGH (ref 70–99)
Glucose-Capillary: 117 mg/dL — ABNORMAL HIGH (ref 70–99)
Glucose-Capillary: 96 mg/dL (ref 70–99)

## 2023-02-20 LAB — AMMONIA: Ammonia: <10 umol/L (ref 9–35)

## 2023-02-20 LAB — VITAMIN B12: Vitamin B-12: 2346 pg/mL — ABNORMAL HIGH (ref 180–914)

## 2023-02-20 LAB — TSH: TSH: 2.854 u[IU]/mL (ref 0.350–4.500)

## 2023-02-20 MED ORDER — ACETAMINOPHEN 500 MG PO TABS
1000.0000 mg | ORAL_TABLET | Freq: Three times a day (TID) | ORAL | Status: AC
  Administered 2023-02-20 – 2023-02-21 (×3): 1000 mg via ORAL
  Filled 2023-02-20 (×3): qty 2

## 2023-02-20 MED ORDER — MELATONIN 5 MG PO TABS
5.0000 mg | ORAL_TABLET | Freq: Every day | ORAL | Status: DC
  Administered 2023-02-20 – 2023-02-23 (×4): 5 mg via ORAL
  Filled 2023-02-20 (×4): qty 1

## 2023-02-20 MED ORDER — BISACODYL 10 MG RE SUPP
10.0000 mg | Freq: Every day | RECTAL | Status: DC
  Administered 2023-02-20 – 2023-02-22 (×2): 10 mg via RECTAL
  Filled 2023-02-20 (×2): qty 1

## 2023-02-20 MED ORDER — VANCOMYCIN HCL IN DEXTROSE 1-5 GM/200ML-% IV SOLN: 1000.0000 mg | INTRAVENOUS | Status: AC

## 2023-02-20 MED ORDER — HYDROCORTISONE 10 MG PO TABS
10.0000 mg | ORAL_TABLET | Freq: Two times a day (BID) | ORAL | Status: DC
  Administered 2023-02-20 – 2023-02-21 (×3): 10 mg via ORAL
  Filled 2023-02-20 (×4): qty 1

## 2023-02-20 MED ORDER — SORBITOL 70 % SOLN: 15.0000 mL | Freq: Three times a day (TID) | Status: DC | PRN

## 2023-02-20 NOTE — Progress Notes (Addendum)
[facility] Progress Note Patient: Requel Muellner WUJ:[id number] DOB: July 19, 1941 DOA: 02/15/2023  DOS: the patient was seen and examined on 02/20/2023  Brief hospital course: PMH of ESRD on HD TTS, HLD, breast cancer, paroxysmal A-fib, GI bleed not on any anticoagulation, nonobstructive CAD, hypothyroidism, adrenal insufficiency, COPD presented to the hospital with complaints of mechanical fall. She was getting into the transport vehicle after her HD and missed a step and fell down injuring her right ankle, right wrist and left arm.  Found to have trimalleolar fracture.  Developed A-fib with RVR on 5/15 requiring Cardizem drip. Having significant issues with pain control.  Developing delirium most likely secondary to psychotropic medications as well as postanesthesia effect. Assessment and Plan: Trimalleolar right ankle fracture closed. After mechanical fall. Orthopedic consulted. Underwent ORIF of the ankle fracture and manual application of stress ankle syndesmosis. Per orthopedic nonweightbearing right leg for 6 weeks, no ankle motion, splint left in place for 3 weeks. DVT prophylaxis-renally dosed Lovenox.  Currently on heparin.  Appears to also have developed delirium postoperatively. Postop and post discharge DVT Px, Weight bearing status, wound management and pain control per Orthopedics.  Pain control Continue IV Dilaudid oral Oxy IR. Continue muscle relaxant. Continue ice and elevation.  Postop delirium. Appears to be significantly confused especially for 48 hours after the surgery. Likely secondary to anesthesia effect. Mentation better but granddaughter reported some visual delusion on 5/18 night. Check TSH, free T4, B12, ammonia. Continue Celexa.  Add melatonin nightly. No focal deficit.  No asterixis.  Paroxysmal A-fib with RVR. Known history of paroxysmal A-fib with RVR preoperatively secondary to pain. Initiated on Cardizem drip. Hold nifedipine.  Now on oral  Cardizem. Continue metoprolol. Limited echocardiogram reassuring with normal EF Not on anticoagulation due to prior history of GI bleed.  Was on Eliquis before.  If tolerates DVT prophylaxis without any significant bleeding in the hospital, might be worth to reinitiate Eliquis.  ESRD HD TTS. Nephrology consulted. Continue HD per schedule.  Had some issues with HD catheter on 5/18.  Adrenal insufficiency. Patient is on Cortef 5 mg twice daily.  Did not receive any stress dose pre or intraoperatively. Postop stress dose was given.  Oral Cortef was increased to 20 mg as well. Will now tapering down to 10 mg twice daily with eventual goal for 5 twice daily over next 48 hours.  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 Acute.  Blood loss anemia Baseline hemoglobin around 12.   Dropped down to 9.3 postoperatively remaining stable. No active bleeding seen. Will monitor for now.  Preoperative cardiac evaluation Tolerated surgery without any cardiac issues.  Nonobstructive CAD. Stress test in 2019 was negative for any acute abnormality. Echocardiogram recently showed EF of 60-65%. Repeated this admission and EF remains same Echocardiogram showed no wall motion abnormality or significant valvular abnormality. Monitor for now.  Postop ileus.  Resolved. Constipation Developed abdominal pain on 5/16.  X-ray abdomen shows evidence of possible ileus as well as constipation. Currently initiated on bowel regimen.  Tolerating oral diet. Repeat x-ray abdomen from 5/18 shows improvement.  Hypothyroidism. Continue Synthroid.  Mood disorder. Continue Celexa. Dose was reduced to 20 mg per renal dosing guidelines.  Family reported that the patient has not tolerated Celexa dose reduction in the past and would like to continue 40 mg daily.  HTN. Holding losartan.  Nifedipine switched to Cardizem.  HLD. Continuing statin.  COPD. No evidence of exacerbation. Continue  inhalers.  And nebulizer. Repeat  chest x-ray for 5/18 shows no acute abnormality.  Obesity Body mass index is 29.75 kg/m.  Placing the pt at higher risk of poor outcomes.   Stage 1 pressure injury left buttocks  POA Continue foam dressing.   Subjective: Pain control still remains an issue.  No nausea no vomiting no fever no chills.  Oral intake adequate.  Passing gas but no BM.  Physical Exam: In moderate distress. S1 and S2 present. Clear to auscultation anterior examination. Mentation appears to have improved.  Data Reviewed: I have Reviewed nursing notes, Vitals, and Lab results. Ordered ammonia, CBC, CMP, T4 and TSH, B12 level. Will reorder CBC tomorrow.  Disposition: Status is: Inpatient Remains inpatient appropriate because: Monitor for postop recovery  heparin injection 5,000 Units Start: 02/19/23 2200   Family Communication: Granddaughter at bedside Level of care: Progressive if no events today we will switch to telemetry. Vitals:   02/19/23 1943 02/19/23 2330 02/20/23 0312 02/20/23 0831  BP: (!) 133/94 99/88 (!) 149/40 (!) 156/94  Pulse: (!) 35 73 73 87  Resp: 19 19 19  (!) 22  Temp: 97.6 F (36.4 C) 98.2 F (36.8 C) 97.9 F (36.6 C)   TempSrc: Oral Oral Oral   SpO2: 100% 99% 100% 100%  Weight:      Height:         Author: Lynden Oxford, MD 02/20/2023 9:56 AM  Please look on [email or web address] to find out who is on call.

## 2023-02-20 NOTE — Progress Notes (Signed)
Subjective: Unable to perform complete HD yesterday (had 53 minutes total ) secondary TDC function.  Cathflo did not help.  Currently for some constipation getting meds, no shortness of breath or chest pain.   Noted rare auditory wheezing O2 sat 100% 2 L nasal cannula, per daughter in room at home has wheezing occasionally. Daughter in room stating was to be evaluated in [facility] vascular OV tomorrow for arm access.   Will consult IR for [facility] placement  Objective Vital signs in last 24 hours: Vitals:   02/19/23 1943 02/19/23 2330 02/20/23 0312 02/20/23 0831  BP: (!) 133/94 99/88 (!) 149/40 (!) 156/94  Pulse: (!) 35 73 73 87  Resp: 19 19 19  (!) 22  Temp: 97.6 F (36.4 C) 98.2 F (36.8 C) 97.9 F (36.6 C)   TempSrc: Oral Oral Oral   SpO2: 100% 99% 100% 100%  Weight:      Height:       Weight change:   Physical Exam: General: Alert chronically ill elderly female NAD Heart: RRR no MRG Lungs: Occasional expiratory wheeze otherwise CTA bilaterally, nonlabored breathing nasal cannula 100% O2 sat Abdomen: NABS, soft, minimal distention but ND Extremities: Right lower leg surgical dressing dry and clear trace bipedal  edema dialysis Access: Left IJ TDC dressing CDI   Dialysis Orders: TTS HD in [place], [place] (Dr Leretha Dykes, [place])  3.5 hr   edw 81 kg , 3k, 2.5 Ca   Bfr 300/ DFR 600    Hep 3200 bolus then 500 q hr Epo 3800 q hd / Needs one more dose iron  for loading (Venofer 200 mg per OP HD  RN)  Zemplar 3.5 mcg  q hd   LIJ TDC     Home meds include - asa, celexa, zetia, breo ellipta, cortef, advil, synthroid, losartan 25 hs, toprol xl 24 qd, mvi, procardia xl 60 hs, prilosec, zocor, spiriva, prns    Assessment/Plan: Fall/ R ankle fracture - s/p ORIF R ankle per ortho 5/16 Afib w/ RVR - on po diltiazem and metoprolol 25 mg ESRD - on HD TTS. On HD.  K4.4, NA 134, Sat 5/18 TDC.  Malfunction as above/consult IR-HD tomorrow after new  [facility] TDC malfunction= consult IR for [facility] switch over  wire/new placement in a.m. HTN/ volume -a.m. BPs 149/40, 156/94 admit BP's low to normal,/on diltiazem and metoprolol, plan for HD tomorrow as above UF 2 -2.5 l, okay to give IV Albumin with HD if SBP < 90. CXR 5/18= no acute cardiopulm. DZ  Anemia esrd - HGB 9.7 <9.4  <11.7, probable postop drop /order for Aranesp 25 mcg= 5/18, as esa as op  and one dose iron Venofer ordered yesterday/follow-up hgb trend  transfuse hgb under 7   MBD ckd - Ca in range, phosphorus 5.3 zemplar on hd   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 DM2 - per pmd COPD-  Kayla Pastel, PA-C [facility] Beeper [phone] 02/20/2023,11:11 AM  LOS: 5 days   Labs: Basic Metabolic Panel: Recent Labs  Lab 02/18/23 0159 02/19/23 0135 02/20/23 1002  NA 133* 133* 134*  K 3.8 4.4 4.4  CL 93* 95* 97*  CO2 28 24 23   GLUCOSE 130* 119* 111*  BUN 24* 42* 51*  CREATININE 3.54* 5.24* 6.00*  CALCIUM 8.2* 8.5* 8.7*  PHOS 5.3*  --   --    Liver Function Tests: Recent Labs  Lab 02/20/23 1002  AST 28  ALT 5  ALKPHOS 60  BILITOT 0.5  PROT 6.0*  ALBUMIN 2.9*   No results for input(s): "LIPASE", "AMYLASE" in the last 168 hours. Recent Labs  Lab 02/20/23 1002  AMMONIA <10   CBC: Recent Labs  Lab 02/15/23 1804 02/17/23 0210 02/18/23 0159 02/19/23 0135 02/20/23 1002  WBC 9.0 15.9* 8.7 10.6* 12.0*  NEUTROABS 6.3  --   --   --  9.1*  HGB 12.2 11.7* 9.3* 9.4* 9.7*  HCT 41.5 38.7 31.0* 31.3* 33.2*  MCV 104.3* 100.5* 101.6* 102.6* 103.4*  PLT 183 209 165 183 225   Cardiac Enzymes: No results for input(s): "CKTOTAL", "CKMB", "CKMBINDEX", "TROPONINI" in the last 168 hours. CBG: Recent Labs  Lab 02/18/23 2121 02/19/23 0603 02/19/23 1144 02/19/23 2103 02/20/23 0618  GLUCAP 122* 106* 101* 98 93    Studies/Results: DG CHEST PORT 1 VIEW  Result Date: 02/19/2023 CLINICAL DATA:  Shortness of breath. EXAM: PORTABLE CHEST 1 VIEW COMPARISON:  One-view chest x-ray 02/15/2023 FINDINGS: Left IJ dialysis catheter  is stable. Heart size upper limits of normal. Lung volumes have decreased some. No focal airspace disease is present. Visualized soft tissues and bony thorax. IMPRESSION: 1. Low lung volumes. 2. No acute cardiopulmonary disease. Electronically Signed   By: Marin Roberts M.D.   On: 02/19/2023 09:40   DG Abd Portable 1V  Result Date: 02/19/2023 CLINICAL DATA:  Shortness of breath.  Ileus. EXAM: PORTABLE ABDOMEN - 1 VIEW COMPARISON:  02/17/2023 FINDINGS: Contrast is identified within the ascending and transverse colon. There has been improvement in dilatation of small bowel loops in the LEFT UPPER QUADRANT. A few LEFT mid abdominal bowel loops remain mildly enlarged, measuring up to 3.1 centimeters. There is no evidence for free intraperitoneal air. Visualized osseous structures have a normal appearance. IMPRESSION: 1. Interval improvement in dilatation of small bowel loops in the LEFT UPPER QUADRANT. 2. Contrast is identified within the ascending and transverse colon. Electronically Signed   By: Norva Pavlov M.D.   On: 02/19/2023 09:31   Medications:   acetaminophen  1,000 mg Oral TID   alteplase  2 mg Intracatheter Once   alteplase  2 mg Intracatheter Once   bisacodyl  10 mg Rectal Daily   Chlorhexidine Gluconate Cloth  6 each Topical Q0600   citalopram  40 mg Oral Daily   darbepoetin (ARANESP) injection - DIALYSIS  25 mcg Subcutaneous Q Sat-1800   diltiazem  120 mg Oral Daily   fluticasone furoate-vilanterol  1 puff Inhalation Daily   heparin injection (subcutaneous)  5,000 Units Subcutaneous Q8H   hydrocortisone  10 mg Oral BID   insulin aspart  0-5 Units Subcutaneous QHS   insulin aspart  0-9 Units Subcutaneous TID WC   levothyroxine  25 mcg Oral Q0600   melatonin  5 mg Oral QHS   metoprolol succinate  25 mg Oral QHS   multivitamin with minerals  1 tablet Oral QPM   paricalcitol  3.5 mcg Intravenous Once per day on Mon Wed Fri   polyethylene glycol  17 g Oral BID    senna-docusate  2 tablet Oral BID   simethicone  80 mg Oral QID   umeclidinium bromide  1 puff Inhalation Daily

## 2023-02-20 NOTE — Plan of Care (Signed)
  Problem: Education: Goal: Knowledge of General Education information will improve Description: Including pain rating scale, medication(s)/side effects and non-pharmacologic comfort measures Outcome: Progressing   Problem: Coping: Goal: Level of anxiety will decrease Outcome: Progressing   Problem: Health Behavior/Discharge Planning: Goal: Ability to manage health-related needs will improve Outcome: Not Progressing   Problem: Clinical Measurements: Goal: Respiratory complications will improve Outcome: Not Progressing   Problem: Activity: Goal: Risk for activity intolerance will decrease Outcome: Not Progressing   Problem: Elimination: Goal: Will not experience complications related to bowel motility Outcome: Not Progressing Goal: Will not experience complications related to urinary retention Outcome: Not Progressing   Problem: Skin Integrity: Goal: Risk for impaired skin integrity will decrease Outcome: Not Progressing

## 2023-02-20 NOTE — Consult Note (Addendum)
Chief Complaint: Patient was seen in consultation today for image guided exchange/new placement of tunneled hemodialysis catheter Chief Complaint  Patient presents with   Fall    Referring Physician(s): Zeyfang,D,PA-C  Supervising Physician: Henn,A  Patient Status: [facility] - In-pt  History of Present Illness: Kayla Vasquez is a 82 y.o. female with past medical history significant for end-stage renal disease on hemodialysis via left IJ catheter which was placed in March of this year in [facility], hyperlipidemia, breast cancer, paroxysmal A-fib, prior GI bleed, anemia, coronary artery disease, obesity, stage I pressure injury left buttocks, hypothyroidism, adrenal insufficiency, COPD who presented to [facility] 02/15/23 after fall at hemodialysis center.  She was found to have a right trimalleolar fracture and also developed A-fib with RVR 5/15 requiring Cardizem drip.  Status post  ORIF of right ankle fracture on 5/16. Pt was unable to complete hemodialysis yesterday secondary to poor dialysis catheter function/? clotted  .Cathflo did not help.  Request now received from nephrology for exchange/new placement of tunneled dialysis catheter.  Past Medical History:  Diagnosis Date   Cancer (HCC)    High cholesterol     Past Surgical History:  Procedure Laterality Date   BREAST SURGERY      Allergies: Amoxicillin-pot clavulanate, Codeine, and Venlafaxine  Medications: Prior to Admission medications   Medication Sig Start Date End Date Taking? Authorizing Provider  albuterol (PROVENTIL HFA) 108 (90 Base) MCG/ACT inhaler Inhale 2 puffs into the lungs every 6 (six) hours as needed for wheezing. 05/16/17  Yes [provider]  albuterol (PROVENTIL) (2.5 MG/3ML) 0.083% nebulizer solution Take 2.5 mg by nebulization every 6 (six) hours as needed for wheezing. 11/26/21  Yes [provider]  aspirin 325 MG tablet Take 325 mg by mouth every evening.   Yes [provider]  Biotin 300 MCG TABS Take 300 mcg by mouth at bedtime.   Yes [provider]  citalopram (CELEXA) 40 MG tablet Take 40 mg by mouth at bedtime. 06/10/16  Yes [provider]  Docusate Sodium (DSS) 100 MG CAPS Take 100 mg by mouth at bedtime. 10/29/22  Yes [provider]  ezetimibe (ZETIA) 10 MG tablet Take 10 mg by mouth at bedtime.   Yes [provider]  fluticasone furoate-vilanterol (BREO ELLIPTA) 100-25 MCG/INH AEPB Inhale 1 puff into the lungs daily. 07/17/18  Yes [provider]  hydrocortisone (CORTEF) 10 MG tablet Take 5 mg by mouth 2 (two) times daily.   Yes [provider]  ibuprofen (ADVIL,MOTRIN) 200 MG tablet Take 400 mg by mouth every 6 (six) hours as needed for mild pain.   Yes [provider]  levothyroxine (SYNTHROID) 25 MCG tablet Take 25 mcg by mouth daily before breakfast. 02/02/22  Yes [provider]  losartan (COZAAR) 25 MG tablet Take 25 mg by mouth every evening. 11/10/22  Yes [provider]  metoprolol succinate (TOPROL-XL) 25 MG 24 hr tablet Take 25 mg by mouth at bedtime. 06/10/22  Yes [provider]  Multiple Vitamin (MULTI-VITAMINS) TABS Take 1 tablet by mouth every evening.   Yes [provider]  NIFEdipine (PROCARDIA XL/NIFEDICAL XL) 60 MG 24 hr tablet Take 60 mg by mouth every evening. 06/22/22  Yes [provider]  omeprazole (PRILOSEC) 40 MG capsule Take 40 mg by mouth every evening.   Yes [provider]  simvastatin (ZOCOR) 40 MG tablet Take 40 mg by mouth every evening.    Yes [provider]  Kayla Vasquez  18 MCG inhalation capsule Place 18 mcg into inhaler and inhale daily.   Yes [provider]     Family History  Problem Relation Age of Onset   Diabetes Mellitus II Mother    Hypertension Mother     Social History   Socioeconomic History   Marital status: Divorced    Spouse name: Not on file   Number of children:  Not on file   Years of education: Not on file   Highest education level: Not on file  Occupational History   Not on file  Tobacco Use   Smoking status: Never   Smokeless tobacco: Never  Substance and Sexual Activity   Alcohol use: Never   Drug use: Never   Sexual activity: Not on file  Other Topics Concern   Not on file  Social History Narrative   Not on file   Social Determinants of Health   Financial Resource Strain: Not on file  Food Insecurity: Not on file  Transportation Needs: Not on file  Physical Activity: Not on file  Stress: Not on file  Social Connections: Not on file      Review of Systems currently denies fever, headache, chest pain, domino pain, back pain, nausea, vomiting or bleeding.  Does have some chronic dyspnea, occasional cough. Also with right ankle pain.  Hard of hearing   Vital Signs: BP (!) 158/58 (BP Location: Left Wrist)   Pulse 70   Temp 97.7 F (36.5 C) (Oral)   Resp 20   Ht 5\' 6"  (1.676 m)   Wt 184 lb 4.9 oz (83.6 kg)   SpO2 98%   BMI 29.75 kg/m   Advance Care Plan: No advanced care documents on file   Physical Exam: Patient awake, hard of hearing, answering simple questions okay.  Chest with few scattered expiratory wheezes.  Heart with regular rate and rhythm.  Abdomen soft, positive bowel sounds, currently nontender; right lower leg/foot bandaged, no sig left lower extremity edema; intact left IJ tunneled HD catheter.  Imaging: DG CHEST PORT 1 VIEW  Result Date: 02/19/2023 CLINICAL DATA:  Shortness of breath. EXAM: PORTABLE CHEST 1 VIEW COMPARISON:  One-view chest x-ray 02/15/2023 FINDINGS: Left IJ dialysis catheter is stable. Heart size upper limits of normal. Lung volumes have decreased some. No focal airspace disease is present. Visualized soft tissues and bony thorax. IMPRESSION: 1. Low lung volumes. 2. No acute cardiopulmonary disease. Electronically Signed   By: Marin Roberts M.D.   On: 02/19/2023 09:40   DG Abd  Portable 1V  Result Date: 02/19/2023 CLINICAL DATA:  Shortness of breath.  Ileus. EXAM: PORTABLE ABDOMEN - 1 VIEW COMPARISON:  02/17/2023 FINDINGS: Contrast is identified within the ascending and transverse colon. There has been improvement in dilatation of small bowel loops in the LEFT UPPER QUADRANT. A few LEFT mid abdominal bowel loops remain mildly enlarged, measuring up to 3.1 centimeters. There is no evidence for free intraperitoneal air. Visualized osseous structures have a normal appearance. IMPRESSION: 1. Interval improvement in dilatation of small bowel loops in the LEFT UPPER QUADRANT. 2. Contrast is identified within the ascending and transverse colon. Electronically Signed   By: Norva Pavlov M.D.   On: 02/19/2023 09:31   DG Abd Portable 1V  Addendum Date: 02/17/2023   ADDENDUM REPORT: 02/17/2023 23:08 ADDENDUM: These results were called by telephone at the time of interpretation on 02/17/2023 at 11:08 pm to provider Dr. Otis Dials, who verbally acknowledged these results. Electronically Signed   By: Darliss Cheney  M.D.   On: 02/17/2023 23:08   Result Date: 02/17/2023 CLINICAL DATA:  Abdominal pain EXAM: PORTABLE ABDOMEN - 1 VIEW COMPARISON:  Abdominal x-ray 12/09/2022 FINDINGS: There are dilated air-filled small bowel loops measuring up to 4.2 cm in the left upper quadrant. Air seen throughout nondilated colon the level of the sigmoid. No suspicious calcifications. A single surgical clip is noted in the pelvis. There is a vascular stent in the left abdomen. Lung bases are clear. No acute fractures are identified. IMPRESSION: Dilated air-filled small bowel loops measuring up to 4.2 cm in the left upper quadrant. Findings are concerning for small bowel obstruction. Electronically Signed: By: Darliss Cheney M.D. On: 02/17/2023 22:25   DG Ankle Complete Right  Result Date: 02/17/2023 CLINICAL DATA:  Right ankle fracture, postoperative examination EXAM: RIGHT ANKLE - COMPLETE 3+ VIEW COMPARISON:   None Available. FINDINGS: Three view radiograph of right ankle performed within an external immobilizer demonstrates surgical changes of bimalleolar ORIF with fracture fragments of the distal fibula and medial malleolus in near anatomic alignment. Posterior malleolar fracture fragment is not well visualized, however, the tibial plafond appears congruent. Normal overall alignment. No unexpected fracture or dislocation. IMPRESSION: 1. Status post bimalleolar ORIF. Fracture fragments in near anatomic alignment. Electronically Signed   By: Helyn Numbers M.D.   On: 02/17/2023 21:37   DG Ankle Complete Right  Result Date: 02/17/2023 CLINICAL DATA:  [id number] Elective surgery [id number] EXAM: RIGHT ANKLE - COMPLETE 3+ VIEW COMPARISON:  Radiograph 02/15/2023 FINDINGS: Intraoperative images during medial and lateral malleolar ORIF. Improved fracture alignment. No evidence of immediate hardware complication. IMPRESSION: Intraoperative images during ankle fracture ORIF. No evidence of immediate hardware complication. Improved fracture alignment. Electronically Signed   By: Caprice Renshaw M.D.   On: 02/17/2023 12:25   DG C-Arm 1-60 Min-No Report  Result Date: 02/17/2023 Fluoroscopy was utilized by the requesting physician.  No radiographic interpretation.   DG C-Arm 1-60 Min-No Report  Result Date: 02/17/2023 Fluoroscopy was utilized by the requesting physician.  No radiographic interpretation.   ECHOCARDIOGRAM LIMITED  Result Date: 02/17/2023    ECHOCARDIOGRAM LIMITED REPORT   Patient Name:   Kayla Vasquez Date of Exam: 02/17/2023 Medical Rec #:  [id number]    Height:       66.0 in Accession #:    [id number]   Weight:       202.8 lb Date of Birth:  05-18-1941    BSA:          2.012 m Patient Age:    81 years     BP:           112/57 mmHg Patient Gender: F            HR:           0 bpm. Exam Location:  Inpatient Procedure: Limited Echo, Cardiac Doppler and Color Doppler Indications:    I48.91* Unspeicified atrial  fibrillation  History:        Patient has no prior history of Echocardiogram examinations.                 COPD; Risk Factors:Hypertension, Diabetes and Dyslipidemia.                 ESRD.  Sonographer:    Sheralyn Boatman RDCS Referring Phys: [id number] [facility] M PATEL  Sonographer Comments: Technically difficult study due to poor echo windows. Patient screaming with leg pain. Pre-op study. Patient with broken leg IMPRESSIONS  1. Left ventricular ejection fraction, by  estimation, is 60 to 65%. The left ventricle has normal function. The left ventricle has no regional wall motion abnormalities. There is moderate concentric left ventricular hypertrophy. Left ventricular diastolic function could not be evaluated.  2. A small pericardial effusion is present. The pericardial effusion is circumferential and posterior to the left ventricle.  3. The mitral valve is degenerative. Moderate mitral annular calcification.  4. The aortic valve is calcified. Aortic valve sclerosis/calcification is present, without any evidence of aortic stenosis. Aortic valve mean gradient measures 7.0 mmHg. Aortic valve Vmax measures 1.78 m/s.  5. There is normal pulmonary artery systolic pressure. The estimated right ventricular systolic pressure is 29.4 mmHg.  6. The inferior vena cava is normal in size with greater than 50% respiratory variability, suggesting right atrial pressure of 3 mmHg. FINDINGS  Left Ventricle: Left ventricular ejection fraction, by estimation, is 60 to 65%. The left ventricle has normal function. The left ventricle has no regional wall motion abnormalities. There is moderate concentric left ventricular hypertrophy. Left ventricular diastolic function could not be evaluated. Left ventricular diastolic function could not be evaluated due to atrial fibrillation. Right Ventricle: There is normal pulmonary artery systolic pressure. The tricuspid regurgitant velocity is 2.57 m/s, and with an assumed right atrial pressure of 3 mmHg, the  estimated right ventricular systolic pressure is 29.4 mmHg. Pericardium: A small pericardial effusion is present. The pericardial effusion is circumferential and posterior to the left ventricle. Mitral Valve: The mitral valve is degenerative in appearance. There is mild thickening of the mitral valve leaflet(s). There is mild calcification of the mitral valve leaflet(s). Moderate mitral annular calcification. MV peak gradient, 14.4 mmHg. The mean mitral valve gradient is 4.0 mmHg. Tricuspid Valve: Tricuspid valve regurgitation is mild. Aortic Valve: The aortic valve is calcified. Aortic valve sclerosis/calcification is present, without any evidence of aortic stenosis. Aortic valve mean gradient measures 7.0 mmHg. Aortic valve peak gradient measures 12.6 mmHg. Venous: The inferior vena cava is normal in size with greater than 50% respiratory variability, suggesting right atrial pressure of 3 mmHg. Additional Comments: Spectral Doppler performed. Color Doppler performed.  LEFT VENTRICLE PLAX 2D LVIDd:         3.50 cm LVIDs:         2.40 cm LV PW:         1.70 cm LV IVS:        1.10 cm LVOT diam:     2.10 cm LVOT Area:     3.46 cm  LV Volumes (MOD) LV vol d, MOD A2C: 46.2 ml LV vol d, MOD A4C: 56.1 ml LV vol s, MOD A2C: 14.8 ml LV vol s, MOD A4C: 19.1 ml LV SV MOD A2C:     31.4 ml LV SV MOD A4C:     56.1 ml LV SV MOD BP:      34.7 ml IVC IVC diam: 1.90 cm AORTIC VALVE AV Vmax:      177.50 cm/s AV Vmean:     123.000 cm/s AV VTI:       0.330 m AV Peak Grad: 12.6 mmHg AV Mean Grad: 7.0 mmHg  AORTA Ao Asc diam: 3.20 cm MITRAL VALVE            TRICUSPID VALVE MV Peak grad: 14.4 mmHg TR Peak grad:   26.4 mmHg MV Mean grad: 4.0 mmHg  TR Vmax:        257.00 cm/s MV Vmax:      1.90 m/s MV Vmean:     93.7  cm/s SHUNTS                         Systemic Diam: 2.10 cm Armanda Magic MD Electronically signed by Armanda Magic MD Signature Date/Time: 02/17/2023/8:27:42 AM    Final    DG Pelvis 1-2 Views  Result Date:  02/15/2023 CLINICAL DATA:  Slipped and fell at the dialysis center.  Pain. EXAM: PELVIS - 1-2 VIEW COMPARISON:  None Available. FINDINGS: No fracture or bone lesion. Hip joints, SI joints and pubic symphysis are normally spaced and aligned. Soft tissues are unremarkable. IMPRESSION: Negative. Electronically Signed   By: Amie Portland M.D.   On: 02/15/2023 17:32   DG Ankle Complete Right  Result Date: 02/15/2023 CLINICAL DATA:  Larey Seat at the dialysis center.  Right ankle pain. EXAM: RIGHT ANKLE - COMPLETE 3+ VIEW COMPARISON:  None Available. FINDINGS: Oblique fracture of the distal fibula extending from the posterior metadiaphysis to the anteromedial distal metaphysis. Posterior fracture component is mildly displaced, 3 mm, posteriorly, and foreshortened by a similar degree. No fracture comminution. There is a nondisplaced fracture across the base of the medial malleolus best appreciated on the lateral view. No fracture comminution. There is also a fracture of the posterior malleolus, superimposed on the distal fibular fracture on the lateral view, without significant displacement and without comminution. Ankle joint is normally spaced and aligned. There is surrounding soft tissue swelling. IMPRESSION: 1. Trimalleolar fracture as detailed above. Ankle joint normally aligned. Electronically Signed   By: Amie Portland M.D.   On: 02/15/2023 17:31   DG Humerus Left  Result Date: 02/15/2023 CLINICAL DATA:  Larey Seat at the dialysis center.  Left arm pain. EXAM: LEFT HUMERUS - 2+ VIEW COMPARISON:  None Available. FINDINGS: There is no evidence of fracture or other focal bone lesions. Soft tissues are unremarkable. IMPRESSION: Negative. Electronically Signed   By: Amie Portland M.D.   On: 02/15/2023 17:28   DG Wrist Complete Right  Result Date: 02/15/2023 CLINICAL DATA:  Larey Seat at the dialysis center.  Right wrist pain. EXAM: RIGHT WRIST - COMPLETE 3+ VIEW COMPARISON:  None Available. FINDINGS: No fracture.  No bone  lesion. Joints are normally spaced and aligned. There is significant dorsal soft tissue swelling. IMPRESSION: No fracture or dislocation. Electronically Signed   By: Amie Portland M.D.   On: 02/15/2023 17:28   DG Chest 2 View  Result Date: 02/15/2023 CLINICAL DATA:  Larey Seat at the dialysis center. EXAM: CHEST - 2 VIEW COMPARISON:  12/30/2022. FINDINGS: Cardiac silhouette is normal in size. No mediastinal or hilar masses or evidence of adenopathy. Small left pleural effusion. Linear opacity in the left mid lung consistent with atelectasis. Remainder of the lungs is clear. No right pleural effusion. No pneumothorax. Total left internal jugular central venous catheter is stable. Skeletal structures are demineralized, but intact. IMPRESSION: 1. No acute cardiopulmonary disease. 2. Small left pleural effusion. Electronically Signed   By: Amie Portland M.D.   On: 02/15/2023 17:26    Labs:  CBC: Recent Labs    02/17/23 0210 02/18/23 0159 02/19/23 0135 02/20/23 1002  WBC 15.9* 8.7 10.6* 12.0*  HGB 11.7* 9.3* 9.4* 9.7*  HCT 38.7 31.0* 31.3* 33.2*  PLT 209 165 183 225    COAGS: No results for input(s): "INR", "APTT" in the last 8760 hours.  BMP: Recent Labs    02/17/23 0210 02/18/23 0159 02/19/23 0135 02/20/23 1002  NA 132* 133* 133* 134*  K 5.3* 3.8 4.4 4.4  CL 94* 93* 95* 97*  CO2 24 28 24 23   GLUCOSE 155* 130* 119* 111*  BUN 48* 24* 42* 51*  CALCIUM 8.7* 8.2* 8.5* 8.7*  CREATININE 5.07* 3.54* 5.24* 6.00*  GFRNONAA 8* 13* 8* 7*    LIVER FUNCTION TESTS: Recent Labs    02/20/23 1002  BILITOT 0.5  AST 28  ALT 5  ALKPHOS 60  PROT 6.0*  ALBUMIN 2.9*    TUMOR MARKERS: No results for input(s): "AFPTM", "CEA", "CA199", "CHROMGRNA" in the last 8760 hours.  Assessment and Plan: 82 y.o. female with past medical history significant for end-stage renal disease on hemodialysis via left IJ catheter which was placed in March of this year in [facility], hyperlipidemia, breast cancer,  paroxysmal A-fib, prior GI bleed, anemia, coronary artery disease, obesity, stage I pressure injury left buttocks, hypothyroidism, adrenal insufficiency, COPD who presented to [facility] 02/15/23 after fall at hemodialysis center.  She was found to have a right trimalleolar fracture and also developed A-fib with RVR 5/15 requiring Cardizem drip.  Status post  ORIF of right ankle fracture on 5/16. Pt was unable to complete hemodialysis yesterday secondary to poor dialysis catheter function/? clotted  .Cathflo did not help.  Request now received from nephrology for exchange/new placement of tunneled dialysis catheter.  Details/risks of procedure including but not limited to, internal bleeding, infection, injury to adjacent structures discussed with patient and granddaughter with her understanding and consent.  Procedure tentatively scheduled for 5/20.   Thank you for this interesting consult.  I greatly enjoyed meeting Heike Delwiche and look forward to participating in their care.  A copy of this report was sent to the requesting provider on this date.  Electronically Signed: D. Jeananne Rama, PA-C 02/20/2023, 12:37 PM   I spent a total of  20 minutes   in face to face in clinical consultation, greater than 50% of which was counseling/coordinating care for image guided tunneled hemodialysis catheter exchange/new placement

## 2023-02-21 ENCOUNTER — Inpatient Hospital Stay (HOSPITAL_COMMUNITY): Payer: Medicare Other

## 2023-02-21 DIAGNOSIS — S82851A Displaced trimalleolar fracture of right lower leg, initial encounter for closed fracture: Secondary | ICD-10-CM | POA: Diagnosis not present

## 2023-02-21 HISTORY — PX: IR FLUORO GUIDE CV LINE LEFT: IMG2282

## 2023-02-21 LAB — CBC WITH DIFFERENTIAL/PLATELET
Abs Immature Granulocytes: 0.17 10*3/uL — ABNORMAL HIGH (ref 0.00–0.07)
Basophils Absolute: 0.1 10*3/uL (ref 0.0–0.1)
Basophils Relative: 1 %
Eosinophils Absolute: 0.2 10*3/uL (ref 0.0–0.5)
Eosinophils Relative: 2 %
HCT: 32.6 % — ABNORMAL LOW (ref 36.0–46.0)
Hemoglobin: 9.7 g/dL — ABNORMAL LOW (ref 12.0–15.0)
Immature Granulocytes: 1 %
Lymphocytes Relative: 11 %
Lymphs Abs: 1.4 10*3/uL (ref 0.7–4.0)
MCH: 30.2 pg (ref 26.0–34.0)
MCHC: 29.8 g/dL — ABNORMAL LOW (ref 30.0–36.0)
MCV: 101.6 fL — ABNORMAL HIGH (ref 80.0–100.0)
Monocytes Absolute: 1.2 10*3/uL — ABNORMAL HIGH (ref 0.1–1.0)
Monocytes Relative: 10 %
Neutro Abs: 9.5 10*3/uL — ABNORMAL HIGH (ref 1.7–7.7)
Neutrophils Relative %: 75 %
Platelets: 216 10*3/uL (ref 150–400)
RBC: 3.21 MIL/uL — ABNORMAL LOW (ref 3.87–5.11)
RDW: 16.5 % — ABNORMAL HIGH (ref 11.5–15.5)
WBC: 12.5 10*3/uL — ABNORMAL HIGH (ref 4.0–10.5)
nRBC: 0 % (ref 0.0–0.2)

## 2023-02-21 LAB — GLUCOSE, CAPILLARY
Glucose-Capillary: 102 mg/dL — ABNORMAL HIGH (ref 70–99)
Glucose-Capillary: 74 mg/dL (ref 70–99)
Glucose-Capillary: 127 mg/dL — ABNORMAL HIGH (ref 70–99)

## 2023-02-21 LAB — PROTIME-INR
INR: 1.1 (ref 0.8–1.2)
Prothrombin Time: 14.1 seconds (ref 11.4–15.2)

## 2023-02-21 MED ORDER — NEPRO/CARBSTEADY PO LIQD
237.0000 mL | Freq: Three times a day (TID) | ORAL | Status: DC
  Administered 2023-02-23: 237 mL via ORAL
  Filled 2023-02-21 (×2): qty 237

## 2023-02-21 MED ORDER — HEPARIN SODIUM (PORCINE) 1000 UNIT/ML IJ SOLN
3.9000 mL | Freq: Once | INTRAMUSCULAR | Status: AC
  Administered 2023-02-21: 3900 [IU] via INTRAVENOUS
  Filled 2023-02-21: qty 4
  Filled 2023-02-21: qty 3.9

## 2023-02-21 MED ORDER — APIXABAN 5 MG PO TABS
5.0000 mg | ORAL_TABLET | Freq: Two times a day (BID) | ORAL | Status: DC
  Administered 2023-02-22: 5 mg via ORAL
  Filled 2023-02-21: qty 1

## 2023-02-21 MED ORDER — LIDOCAINE HCL 1 % IJ SOLN
INTRAMUSCULAR | Status: AC
  Filled 2023-02-21: qty 20

## 2023-02-21 MED ORDER — DARBEPOETIN ALFA 25 MCG/0.42ML IJ SOSY
25.0000 ug | PREFILLED_SYRINGE | INTRAMUSCULAR | Status: DC
  Filled 2023-02-21: qty 0.42

## 2023-02-21 MED ORDER — HEPARIN SODIUM (PORCINE) 1000 UNIT/ML IJ SOLN
INTRAMUSCULAR | Status: AC
  Filled 2023-02-21: qty 10

## 2023-02-21 NOTE — Progress Notes (Signed)
   02/21/23 1749  Vitals  Temp 98.2 F (36.8 C)  Pulse Rate (!) 106  Resp 19  BP 92/73  SpO2 100 %  O2 Device Nasal Cannula  Type of Weight Post-Dialysis  Oxygen Therapy  O2 Flow Rate (L/min) 2 L/min  Patient Activity (if Appropriate) In bed  Pulse Oximetry Type Continuous  Oximetry Probe Site Changed No  Post Treatment  Dialyzer Clearance Lightly streaked  Duration of HD Treatment -hour(s) 3.25 hour(s)  Hemodialysis Intake (mL) 0 mL  Liters Processed 57.3  Fluid Removed (mL) 2200 mL  Tolerated HD Treatment Yes   Received patient in bed to unit.  Alert and oriented.  Informed consent signed and in chart.   TX duration: 3.25 hrs  Patient tolerated well.  Transported back to the room  Alert, without acute distress.  Hand-off given to patient's nurse.   Access used: [facility] Access issues: none  Total UF removed: 2.2L Medication(s) given: none    [facility]

## 2023-02-21 NOTE — Progress Notes (Signed)
IVT consult placed for PIV placement for HD cath procedure. Patient is restricted to right arm only. Assessed right arm and attempted x1 with no success. Patient has extremely limited peripheral vascular options. Cephalic is noncompressible. Right AC site is edematous with slight purulent drainage. IR MD notified and is going to contact nephrology for PICC line approval during HD cath insertion.   Catlynn Grondahl Loyola Mast, RN

## 2023-02-21 NOTE — Plan of Care (Signed)
  Problem: Health Behavior/Discharge Planning: Goal: Ability to manage health-related needs will improve Outcome: Not Progressing   Problem: Clinical Measurements: Goal: Respiratory complications will improve Outcome: Not Progressing   Problem: Coping: Goal: Level of anxiety will decrease Outcome: Not Progressing   Problem: Skin Integrity: Goal: Risk for impaired skin integrity will decrease Outcome: Not Progressing

## 2023-02-21 NOTE — TOC Progression Note (Signed)
Transition of Care [facility]) - Progression Note    Patient Details  Name: Kayla Vasquez MRN: [id number] Date of Birth: 03-May-1941  [facility] of [facility]) CM/SW Contact  Eduard Roux, [place] Phone Number: 02/21/2023, 11:53 AM  Clinical Narrative:     CSW spoke with patient's daughter informed of bed offers in [facility] area as requested. She states she will discuss offers with the daughter and call CSW back.   Expected Discharge Plan: Skilled Nursing Facility Barriers to Discharge: Continued Medical Work up  Expected Discharge Plan and Services In-house Referral: Clinical Social Work     Living arrangements for the past 2 months: Single Family Home                                       Social Determinants of Health (SDOH) Interventions SDOH Screenings   Tobacco Use: Low Risk  (02/17/2023)    Readmission Risk Interventions     No data to display

## 2023-02-21 NOTE — Progress Notes (Signed)
Patient ID: Kayla Vasquez, female   DOB: September 15, 1941, 82 y.o.   MRN: [id number] [facility] Progress Note   Assessment/ Plan:   1.  Status post fall with trimalleolar right ankle fracture: Underwent open reduction and internal fixation by orthopedic surgery on 02/17/2023. 2. ESRD: On TTS hemodialysis schedule as an outpatient with next dialysis ordered for tomorrow.  She has been having dysfunction of her left IJ [facility] and interventional radiology consulted yesterday for assistance with exchange after lack of improvement with tPA/port flushes. 3. Anemia: Postoperative drop noted, status post ESA on Saturday.  No overt blood loss noted. 4. CKD-MBD: Calcium level acceptable with ongoing Zemplar for PTH control.  Currently not on binder. 5.  Atrial fibrillation: Currently rate controlled on diltiazem and metoprolol.  Not on anticoagulation due to history of GI bleed. 6.  Adrenal insufficiency: On supplementation with hydrocortisone with plans noted for taper down to maintenance from postoperative stress dosing.  Subjective:   Reports intermittent right ankle pain overnight.  Denies chest pain and is comfortable on oxygen via nasal cannula.   Objective:   BP (!) 142/59 (BP Location: Left Wrist)   Pulse 68   Temp 98.2 F (36.8 C) (Oral)   Resp 16   Ht 5\' 6"  (1.676 m)   Wt 87.1 kg   SpO2 99%   BMI 30.99 kg/m   Physical Exam: Gen: Comfortably resting in bed, hard of hearing but awakens to calling out her name/responds appropriately. CVS: Pulse regular rhythm, normal rate, S1 and S2 normal.  Left IJ TDC Resp: Fine rales left base otherwise clear to auscultation.  No distinct wheeze.  On oxygen via [place] Abd: Soft, flat, nontender, bowel sounds normal Ext: Right leg surgical dressing is intact.  Trace pedal edema  Labs: BMET Recent Labs  Lab 02/15/23 1804 02/17/23 0210 02/18/23 0159 02/19/23 0135 02/20/23 1002  NA 136 132* 133* 133* 134*  K 5.4* 5.3* 3.8 4.4 4.4  CL 101 94*  93* 95* 97*  CO2 25 24 28 24 23   GLUCOSE 81 155* 130* 119* 111*  BUN 21 48* 24* 42* 51*  CREATININE 2.36* 5.07* 3.54* 5.24* 6.00*  CALCIUM 8.6* 8.7* 8.2* 8.5* 8.7*  PHOS  --   --  5.3*  --   --    CBC Recent Labs  Lab 02/15/23 1804 02/17/23 0210 02/18/23 0159 02/19/23 0135 02/20/23 1002 02/21/23 0205  WBC 9.0   < > 8.7 10.6* 12.0* 12.5*  NEUTROABS 6.3  --   --   --  9.1* 9.5*  HGB 12.2   < > 9.3* 9.4* 9.7* 9.7*  HCT 41.5   < > 31.0* 31.3* 33.2* 32.6*  MCV 104.3*   < > 101.6* 102.6* 103.4* 101.6*  PLT 183   < > 165 183 225 216   < > = values in this interval not displayed.      Medications:     acetaminophen  1,000 mg Oral TID   alteplase  2 mg Intracatheter Once   alteplase  2 mg Intracatheter Once   bisacodyl  10 mg Rectal Daily   Chlorhexidine Gluconate Cloth  6 each Topical Q0600   citalopram  40 mg Oral Daily   darbepoetin (ARANESP) injection - DIALYSIS  25 mcg Subcutaneous Q Sat-1800   diltiazem  120 mg Oral Daily   fluticasone furoate-vilanterol  1 puff Inhalation Daily   heparin injection (subcutaneous)  5,000 Units Subcutaneous Q8H   hydrocortisone  10 mg Oral BID   insulin  aspart  0-5 Units Subcutaneous QHS   insulin aspart  0-9 Units Subcutaneous TID WC   levothyroxine  25 mcg Oral Q0600   melatonin  5 mg Oral QHS   metoprolol succinate  25 mg Oral QHS   multivitamin with minerals  1 tablet Oral QPM   paricalcitol  3.5 mcg Intravenous Once per day on Mon Wed Fri   polyethylene glycol  17 g Oral BID   senna-docusate  2 tablet Oral BID   simethicone  80 mg Oral QID   umeclidinium bromide  1 puff Inhalation Daily   Zetta Bills, MD 02/21/2023, 8:39 AM

## 2023-02-21 NOTE — Progress Notes (Signed)
Occupational Therapy Treatment Patient Details Name: Kayla Vasquez MRN: [id number] DOB: [birth year] Today's Date: 02/21/2023   History of present illness Pt is a 82 y/o F admitted on 02/15/23 after presenting with c/o mechanical fall. Pt found to have trimalleolar fx & developed a-fib with RVR on 02/16/23 requiring Cardizem drip. Pt is s/p ORIF R ankle without fixation of the posterior lip, manual application of stress ankle syndesmosis under fluoroscopy on 02/17/23. PMH: ERD, CA, high cholesterol   OT comments  Patient with little to no pain initially but increased pain with movement. Patient progress with grooming seated on EOB with patient requiring min assist to min guard assist for sitting balance. Patient stating she felt fearful of falling when performing lateral scooting with cues for trunk flexion. Patient limited by lethargic during this visit which increased during session. Patient to continue to be followed by Acute OT.    Recommendations for follow up therapy are one component of a multi-disciplinary discharge planning process, led by the attending physician.  Recommendations may be updated based on patient status, additional functional criteria and insurance authorization.    Assistance Recommended at Discharge Frequent or constant Supervision/Assistance  Patient can return home with the following  Two people to help with walking and/or transfers;A lot of help with bathing/dressing/bathroom;Assistance with cooking/housework;Direct supervision/assist for medications management;Assist for transportation;Help with stairs or ramp for entrance;Direct supervision/assist for financial management;Assistance with feeding   Equipment Recommendations  Other (comment) (pending patient progress, no recs at this time)    Recommendations for Other Services      Precautions / Restrictions Precautions Precautions: Fall Precaution Comments: extreme pain Required Braces or Orthoses:  Splint/Cast Splint/Cast: R Lower leg Restrictions Weight Bearing Restrictions: Yes RLE Weight Bearing: Non weight bearing       Mobility Bed Mobility Overal bed mobility: Needs Assistance Bed Mobility: Supine to Sit, Sit to Supine Rolling:  (roll to Left but not to the Right)   Supine to sit: HOB elevated, +2 for physical assistance, Mod assist Sit to supine: HOB elevated, Max assist, +2 for safety/equipment   General bed mobility comments: increased time and cues throughout, max assist x2 to return to supine    Transfers Overall transfer level: Needs assistance   Transfers: Bed to chair/wheelchair/BSC            Lateral/Scoot Transfers: +2 physical assistance, Total assist General transfer comment: Patient stated she was fearful of falling and requiring cues to lean forward to assist with lateral scooting     Balance Overall balance assessment: Needs assistance Sitting-balance support: Feet supported, Bilateral upper extremity supported Sitting balance-Leahy Scale: Fair Sitting balance - Comments: required min guard to min assist due to increased lethargy and complaints of possbile fainting                                   ADL either performed or assessed with clinical judgement   ADL Overall ADL's : Needs assistance/impaired     Grooming: Wash/dry hands;Wash/dry face;Min guard;Sitting Grooming Details (indicate cue type and reason): on EOB                               General ADL Comments: focused on bed mobility, sitting balance, and lateral scooting    Extremity/Trunk Assessment Upper Extremity Assessment RUE Deficits / Details: Pain at forearm and wrist, significant bruising dorsolateral aspect of R  wrist; able to lift UE up against gravity and actively move fingers   Lower Extremity Assessment RLE Deficits / Details: sensation intact to light touch at R toes, with + active toe flex/extend; Significant pain limiting all other  movement of RLE; repositioned in elevation        Vision       Perception     Praxis      Cognition Arousal/Alertness: Lethargic, Suspect due to medications Behavior During Therapy: WFL for tasks assessed/performed, Flat affect Overall Cognitive Status: Impaired/Different from baseline Area of Impairment: Orientation, Attention, Memory, Following commands, Awareness, Safety/judgement, Problem solving                     Memory: Decreased recall of precautions, Decreased short-term memory Following Commands: Follows one step commands consistently, Follows one step commands with increased time, Follows multi-step commands with increased time     Problem Solving: Decreased initiation, Slow processing, Requires verbal cues, Requires tactile cues General Comments: very lethargic at end of session, very [facility]        Exercises      Shoulder Instructions       General Comments BP sitting on EOB 123/81 and 119/46 when back in supine    Pertinent Vitals/ Pain       Pain Assessment Pain Assessment: Faces Faces Pain Scale: Hurts even more Pain Location: RLE with movement Pain Descriptors / Indicators: Grimacing, Guarding, Discomfort Pain Intervention(s): Limited activity within patient's tolerance, Monitored during session, Repositioned  Home Living Family/patient expects to be discharged to:: Private residence Living Arrangements: Alone Available Help at Discharge: Family Type of Home: Mobile home Home Access: Ramped entrance     Home Layout: One level     Bathroom Shower/Tub: Arts development officer Toilet: Handicapped height     Home Equipment: Shower seat - built in;Shower seat;Grab bars - toilet;Grab bars - tub/shower;Rolling Walker (2 wheels);Cane - single point   Additional Comments: Will need more information re: bathroom setup      Prior Functioning/Environment              Frequency  Min 1X/week        Progress Toward Goals  OT  Goals(current goals can now be found in the care plan section)  Progress towards OT goals: Progressing toward goals  Acute Rehab OT Goals Patient Stated Goal: get better OT Goal Formulation: With patient Time For Goal Achievement: 03/04/23 Potential to Achieve Goals: Fair ADL Goals Pt Will Perform Grooming: sitting;with supervision;with set-up Pt Will Transfer to Toilet: bedside commode;squat pivot transfer;with min assist Additional ADL Goal #1: Pt will demonstrate compliance with bed level exercise program to promote/maintain strength Additional ADL Goal #2: pt will be able to tolearte EOB for ~5 mins in prep for grooming  Plan Discharge plan remains appropriate    Co-evaluation    PT/OT/SLP Co-Evaluation/Treatment: Yes Reason for Co-Treatment: Complexity of the patient's impairments (multi-system involvement);For patient/therapist safety PT goals addressed during session: Mobility/safety with mobility;Strengthening/ROM OT goals addressed during session: ADL's and self-care      AM-PAC OT "6 Clicks" Daily Activity     Outcome Measure   Help from another person eating meals?: A Lot Help from another person taking care of personal grooming?: A Lot Help from another person toileting, which includes using toliet, bedpan, or urinal?: Total Help from another person bathing (including washing, rinsing, drying)?: Total Help from another person to put on and taking off regular upper body clothing?: A Lot  Help from another person to put on and taking off regular lower body clothing?: Total 6 Click Score: 9    End of Session Equipment Utilized During Treatment: Gait belt;Oxygen (4 liters)  OT Visit Diagnosis: History of falling (Z91.81);Unsteadiness on feet (R26.81);Muscle weakness (generalized) (M62.81);Other abnormalities of gait and mobility (R26.89);Pain Pain - Right/Left: Right Pain - part of body: Leg   Activity Tolerance Patient limited by lethargy;Patient limited by pain    Patient Left in bed;with call bell/phone within reach;with bed alarm set   Nurse Communication Mobility status        Time: 9629-5284 OT Time Calculation (min): 30 min  Charges: OT General Charges $OT Visit: 1 Visit OT Treatments $Self Care/Home Management : 8-22 mins  Alfonse Flavors, OTA Acute Rehabilitation Services  Office [phone]   Dewain Penning 02/21/2023, 11:25 AM

## 2023-02-21 NOTE — Procedures (Signed)
Interventional Radiology Procedure:   Indications: Non-functional dialysis catheter  Procedure: Tunneled dialysis catheter exchange  Findings: New left chest 23 cm tip to cuff Palindrome.  Tip in right atrium.  Complications: None     EBL: Minimal, less than 10 ml  Plan: Dialysis catheter is ready to use.    Sarajane Fambrough R. Lowella Dandy, MD  Pager: [phone]

## 2023-02-21 NOTE — Progress Notes (Signed)
Pt came to floor with restricted band to the left arm and insisted we use her left arm for blood pressures. We educated her and said the band means we can't use the arm. Pt told us she can't use the right arm there is no band on the right arm.. So we put it on the left leg and pt said no it hurts. Dr. Lynden Oxford, MD notified came to educate pt and granddaughter Melody that we can't use the left arm due to left side breast cancer. They insisted that we use the left wrist, Dr Allena Katz explained the risk to pt and the granddaughter Melody and they understand and insist we use the left wrist for blood pressures.

## 2023-02-21 NOTE — Progress Notes (Signed)
Bleeding at site of HD cath. Dressing changed. Patient tolerated well

## 2023-02-21 NOTE — Progress Notes (Signed)
ANTICOAGULATION CONSULT NOTE - Initial Consult  Pharmacy Consult for Eliquis Indication: atrial fibrillation  Allergies  Allergen Reactions   Amoxicillin-Pot Clavulanate Rash   Codeine Nausea And Vomiting   Venlafaxine Other (See Comments)    Hallucination       Patient Measurements: Height: 5\' 6"  (167.6 cm) Weight: 87.1 kg (192 lb 0.3 oz) IBW/kg (Calculated) : 59.3  Vital Signs: Temp: 98.7 F (37.1 C) (05/20 1409) Temp Source: Oral (05/20 1409) BP: 107/96 (05/20 1700) Pulse Rate: 57 (05/20 1630)  Labs: Recent Labs    02/19/23 0135 02/20/23 1002 02/21/23 0205  HGB 9.4* 9.7* 9.7*  HCT 31.3* 33.2* 32.6*  PLT 183 225 216  LABPROT  --   --  14.1  INR  --   --  1.1  CREATININE 5.24* 6.00*  --     Estimated Creatinine Clearance: 8.4 mL/min (A) (by C-G formula based on SCr of 6 mg/dL (H)).   Medical History: Past Medical History:  Diagnosis Date   Cancer (HCC)    High cholesterol     Medications:  Scheduled:   acetaminophen  1,000 mg Oral TID   alteplase  2 mg Intracatheter Once   alteplase  2 mg Intracatheter Once   bisacodyl  10 mg Rectal Daily   Chlorhexidine Gluconate Cloth  6 each Topical Q0600   citalopram  40 mg Oral Daily   darbepoetin (ARANESP) injection - DIALYSIS  25 mcg Subcutaneous Q Mon-1800   diltiazem  120 mg Oral Daily   feeding supplement (NEPRO CARB STEADY)  237 mL Oral TID BM   fluticasone furoate-vilanterol  1 puff Inhalation Daily   hydrocortisone  10 mg Oral BID   insulin aspart  0-5 Units Subcutaneous QHS   insulin aspart  0-9 Units Subcutaneous TID WC   levothyroxine  25 mcg Oral Q0600   melatonin  5 mg Oral QHS   metoprolol succinate  25 mg Oral QHS   multivitamin with minerals  1 tablet Oral QPM   paricalcitol  3.5 mcg Intravenous Once per day on Mon Wed Fri   polyethylene glycol  17 g Oral BID   senna-docusate  2 tablet Oral BID   simethicone  80 mg Oral QID   umeclidinium bromide  1 puff Inhalation Daily     Assessment: 82 yo female previously on Eliquis for hx Afib but was discontinued due to history of GIB. Noted to have Afib w/ RVR this admit. CHADS-VASc = 4. Patient has tolerated SQH this admission. Hgb 9's - stable. Patient is s/p tunneled HD catheter exchange on 5/20. Pharmacy has been consulted to re-initiate Eliquis for Afib - first doses to be given 5/21 since recent procedure.   Goal of Therapy:  Monitor platelets by anticoagulation protocol: Yes   Plan:  Eliquis 5mg  PO BID starting 5/21 (age < 42yrs, weight > 60kg) Monitor for s/sx bleeding, CBC  Rexford Maus, PharmD, BCPS 02/21/2023 5:23 PM

## 2023-02-21 NOTE — Progress Notes (Signed)
Physical Therapy Treatment Patient Details Name: Kayla Vasquez MRN: [id number] DOB: Aug 12, 1941 Today's Date: 02/21/2023   History of Present Illness Pt is a 82 y/o F admitted on 02/15/23 after presenting with c/o mechanical fall. Pt found to have trimalleolar fx & developed a-fib with RVR on 02/16/23 requiring Cardizem drip. Pt is s/p ORIF R ankle without fixation of the posterior lip, manual application of stress ankle syndesmosis under fluoroscopy on 02/17/23. PMH: ERD, CA, high cholesterol    PT Comments    Pt recently medicated and slightly lethargic throughout session today. Pt also complaining of feeling like she is going to faint with sitting up. Pt requiring total Ax 2 for bed mobility and for lateral scoot transfers along EoB with assist for maintaining NWB through R LE.  Pt to have TDC replaced today. PT will continue to follow acutely.   Recommendations for follow up therapy are one component of a multi-disciplinary discharge planning process, led by the attending physician.  Recommendations may be updated based on patient status, additional functional criteria and insurance authorization.  Follow Up Recommendations  Can patient physically be transported by private vehicle: No    Assistance Recommended at Discharge Frequent or constant Supervision/Assistance  Patient can return home with the following Two people to help with walking and/or transfers;Two people to help with bathing/dressing/bathroom;Direct supervision/assist for medications management;Assist for transportation;Help with stairs or ramp for entrance;Direct supervision/assist for financial management;Assistance with cooking/housework   Equipment Recommendations  None recommended by PT (TBD in next venue)    Recommendations for Other Services OT consult (Consider ordering postop)     Precautions / Restrictions Precautions Precautions: Fall Precaution Comments: extreme pain Required Braces or Orthoses:  Splint/Cast Splint/Cast: R Lower leg Restrictions Weight Bearing Restrictions: Yes RLE Weight Bearing: Non weight bearing     Mobility  Bed Mobility Overal bed mobility: Needs Assistance Bed Mobility: Supine to Sit, Sit to Supine Rolling: Mod assist (Roll to L but not to the R)   Supine to sit: HOB elevated, +2 for physical assistance, Mod assist Sit to supine: HOB elevated, Max assist, +2 for safety/equipment   General bed mobility comments: max cuing but pt able to navigate LE off bed with assist to move R LE, requires mod-maxA for bringing trunk to upright, once in upright pt reports nausea and "like I'm gonna pass out.", requires maxAx2 for returning LE to bed and for positioning trunk and hips,    Transfers Overall transfer level: Needs assistance   Transfers: Bed to chair/wheelchair/BSC            Lateral/Scoot Transfers: +2 physical assistance, Total assist General transfer comment: with increased cuing for weight shifting and assist for keeping RLE elevated pt requires total A for lateral scoot transfert to HoB with use of bed pad.        Balance Overall balance assessment: Needs assistance Sitting-balance support: Feet supported, Bilateral upper extremity supported Sitting balance-Leahy Scale: Fair Sitting balance - Comments: PT sat beside pt due to increased lethargy and reports of feeling faint. When pt fully awake able to keep balance without outside support                                    Cognition Arousal/Alertness: Lethargic, Suspect due to medications Behavior During Therapy: WFL for tasks assessed/performed, Flat affect Overall Cognitive Status: Impaired/Different from baseline Area of Impairment: Orientation, Attention, Memory, Following commands, Awareness, Safety/judgement, Problem solving  Memory: Decreased recall of precautions, Decreased short-term memory Following Commands: Follows one step commands  consistently, Follows one step commands with increased time, Follows multi-step commands with increased time (when she hears them)     Problem Solving: Decreased initiation, Slow processing, Requires verbal cues, Requires tactile cues General Comments: Pt very [facility] as well as slightly lethargic from pain medication recently given, requires increased cuing for task completion        Exercises General Exercises - Lower Extremity Hip Flexion/Marching: AROM, AAROM, Left, 5 reps, Seated    General Comments General comments (skin integrity, edema, etc.): Pt on 4L O2 via [place] through out SpO2 > 90%O2, HR with short bout in high 120s, returned to 63bpm with return to supine, BP in sitting 123/81 with return to supine BP 119/46.      Pertinent Vitals/Pain Pain Assessment Pain Assessment: Faces Faces Pain Scale: Hurts even more Breathing: normal Pain Location: RLE with movement Pain Descriptors / Indicators: Grimacing, Guarding, Discomfort Pain Intervention(s): Monitored during session    Home Living Family/patient expects to be discharged to:: Private residence Living Arrangements: Alone Available Help at Discharge: Family Type of Home: Mobile home Home Access: Ramped entrance       Home Layout: One level Home Equipment: Shower seat - built in;Shower seat;Grab bars - toilet;Grab bars - tub/shower;Rolling Walker (2 wheels);Cane - single point Additional Comments: Will need more information re: bathroom setup    Prior Function            PT Goals (current goals can now be found in the care plan section) Acute Rehab PT Goals Patient Stated Goal: get better, decreased pain PT Goal Formulation: With patient/family Time For Goal Achievement: 03/02/23 Potential to Achieve Goals: Fair Progress towards PT goals: Progressing toward goals    Frequency    Min 3X/week      PT Plan      Co-evaluation PT/OT/SLP Co-Evaluation/Treatment: Yes Reason for Co-Treatment: Complexity of  the patient's impairments (multi-system involvement);For patient/therapist safety PT goals addressed during session: Mobility/safety with mobility;Strengthening/ROM        AM-PAC PT "6 Clicks" Mobility   Outcome Measure  Help needed turning from your back to your side while in a flat bed without using bedrails?: A Little Help needed moving from lying on your back to sitting on the side of a flat bed without using bedrails?: A Lot Help needed moving to and from a bed to a chair (including a wheelchair)?: Total Help needed standing up from a chair using your arms (e.g., wheelchair or bedside chair)?: Total Help needed to walk in hospital room?: Total Help needed climbing 3-5 steps with a railing? : Total 6 Click Score: 9    End of Session Equipment Utilized During Treatment: Oxygen Activity Tolerance: Patient limited by pain;Patient limited by fatigue;Patient limited by lethargy Patient left: in bed;with call bell/phone within reach;with bed alarm set Nurse Communication:  (increased lethargy and feeling lightheaded with sitting) PT Visit Diagnosis: Pain;Other abnormalities of gait and mobility (R26.89);Muscle weakness (generalized) (M62.81);Difficulty in walking, not elsewhere classified (R26.2) Pain - Right/Left: Right Pain - part of body: Leg;Ankle and joints of foot     Time: 4098-1191 PT Time Calculation (min) (ACUTE ONLY): 30 min  Charges:  $Therapeutic Activity: 8-22 mins                     Shoshana Johal B. Beverely Risen PT, DPT Acute Rehabilitation Services Please use secure chat or  Call Office [phone]  Elon Alas [facility] 02/21/2023, 9:39 AM

## 2023-02-21 NOTE — Progress Notes (Addendum)
[facility] Progress Note Patient: Kayla Vasquez UEA:[id number] DOB: 06/15/1941 DOA: 02/15/2023  DOS: the patient was seen and examined on 02/21/2023  Brief hospital course: PMH of ESRD on HD TTS, HLD, breast cancer, paroxysmal A-fib, GI bleed not on any anticoagulation, nonobstructive CAD, hypothyroidism, adrenal insufficiency, COPD presented to the hospital with complaints of mechanical fall. She was getting into the transport vehicle after her HD and missed a step and fell down injuring her right ankle, right wrist and left arm.  Found to have trimalleolar fracture.  Developed A-fib with RVR on 5/15 requiring Cardizem drip. Having significant issues with pain control.  Developing delirium most likely secondary to psychotropic medications as well as postanesthesia effect. Assessment and Plan: Trimalleolar right ankle fracture closed. After mechanical fall. Orthopedic consulted. Underwent ORIF of the ankle fracture and manual application of stress ankle syndesmosis. Per orthopedic nonweightbearing right leg for 6 weeks, no ankle motion, splint left in place for 3 weeks. DVT prophylaxis recommendation is for DVT prophylaxis chemical.  Given that the patient is refusing injectables and her HD status will initiate Eliquis.  Monitor for 12 hours to ensure tolerance.  Appears to also have developed delirium postoperatively. Postop and post discharge DVT Px, Weight bearing status, wound management and pain control per Orthopedics.  Pain control Continue IV Dilaudid oral Oxy IR. Continue muscle relaxant. Continue ice and elevation.  Postop delirium. Appears to be significantly confused especially for 48 hours after the surgery. Likely secondary to anesthesia effect. Mentation better but granddaughter reported some visual delusion on 5/18 night. Check TSH, free T4, B12, ammonia. Continue Celexa.  Add melatonin nightly. No focal deficit.  No asterixis.  Paroxysmal A-fib with RVR. Known  history of paroxysmal A-fib with RVR preoperatively secondary to pain. Initiated on Cardizem drip. Hold nifedipine.  Now on oral Cardizem. Continue metoprolol. Limited echocardiogram reassuring with normal EF Not on anticoagulation due to prior history of GI bleed. Will initiate Eliquis 2.5 mg twice daily and monitor.  ESRD HD TTS. Nephrology consulted. Continue HD per schedule.  HD catheter clotted and required repair.  Monitor.  Adrenal insufficiency. Patient is on Cortef 5 mg twice daily.  Did not receive any stress dose pre or intraoperatively. Postop stress dose was given.  Oral Cortef was increased to 20 mg as well. Will now tapering down to 10 mg twice daily with eventual goal for 5 twice daily over next 48 hours.  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 Acute.  Blood loss anemia Baseline hemoglobin around 12.   Dropped down to 9.3 postoperatively remaining stable. No active bleeding seen. Will monitor for now.  Preoperative cardiac evaluation Tolerated surgery without any cardiac issues.  Nonobstructive CAD. Stress test in 2019 was negative for any acute abnormality. Echocardiogram recently showed EF of 60-65%. Repeated this admission and EF remains same Echocardiogram showed no wall motion abnormality or significant valvular abnormality. Monitor for now.  Postop ileus.  Resolved. Constipation Developed abdominal pain on 5/16.  X-ray abdomen shows evidence of possible ileus as well as constipation. Currently initiated on bowel regimen.  Tolerating oral diet. Repeat x-ray abdomen from 5/18 shows improvement.  Hypothyroidism. Continue Synthroid.  Mood disorder. Continue Celexa. Dose was reduced to 20 mg per renal dosing guidelines.  Family reported that the patient has not tolerated Celexa dose reduction in the past and would like to continue 40 mg daily.  HTN. Holding losartan.  Nifedipine switched to Cardizem.  HLD. Continuing  statin.  COPD. No  evidence of exacerbation. Continue inhalers.  And nebulizer. Repeat chest x-ray for 5/18 shows no acute abnormality.  Obesity Body mass index is 30.99 kg/m.  Placing the pt at higher risk of poor outcomes.   Stage 1 pressure injury left buttocks  POA Continue foam dressing.   Left extremity restriction. Patient does have history of breast cancer and therefore left upper extremity is wrist should be restricted.  But that is the only extremities where she can get an accurate blood pressure-year-old continue to use left upper extremity wrist for blood pressure measurements  Subjective: Pain controlled.  No nausea no vomiting.  Physical Exam: In mild distress.  S1-S2 present.  Clear to auscultation.  Mentation significantly better.  Data Reviewed: I have Reviewed nursing notes, Vitals, and Lab results. Reviewed CBC and BMP.  Reordered CBC and BMP.  Disposition: Status is: Inpatient Remains inpatient appropriate because: Monitor for postop recovery   apixaban (ELIQUIS) tablet 5 mg   Family Communication: Granddaughter at bedside Level of care: Progressive continue due to hyptension. Vitals:   02/21/23 1730 02/21/23 1740 02/21/23 1744 02/21/23 1749  BP: 103/78 (!) 72/55 (!) 79/60 92/73  Pulse: (!) 45  (!) 25 (!) 106  Resp: 13 19 17 19   Temp:    98.2 F (36.8 C)  TempSrc:      SpO2: 96%  96% 100%  Weight:      Height:         Author: Lynden Oxford, MD 02/21/2023 6:35 PM  Please look on [email or web address] to find out who is on call.

## 2023-02-22 ENCOUNTER — Encounter (HOSPITAL_COMMUNITY): Payer: Self-pay | Admitting: Orthopedic Surgery

## 2023-02-22 DIAGNOSIS — S82851A Displaced trimalleolar fracture of right lower leg, initial encounter for closed fracture: Secondary | ICD-10-CM | POA: Diagnosis not present

## 2023-02-22 LAB — GLUCOSE, CAPILLARY
Glucose-Capillary: 85 mg/dL (ref 70–99)
Glucose-Capillary: 96 mg/dL (ref 70–99)
Glucose-Capillary: 104 mg/dL — ABNORMAL HIGH (ref 70–99)
Glucose-Capillary: 119 mg/dL — ABNORMAL HIGH (ref 70–99)
Glucose-Capillary: 112 mg/dL — ABNORMAL HIGH (ref 70–99)

## 2023-02-22 LAB — CBC
HCT: 31.9 % — ABNORMAL LOW (ref 36.0–46.0)
Hemoglobin: 9.6 g/dL — ABNORMAL LOW (ref 12.0–15.0)
MCH: 30.4 pg (ref 26.0–34.0)
MCHC: 30.1 g/dL (ref 30.0–36.0)
MCV: 100.9 fL — ABNORMAL HIGH (ref 80.0–100.0)
Platelets: 211 10*3/uL (ref 150–400)
RBC: 3.16 MIL/uL — ABNORMAL LOW (ref 3.87–5.11)
RDW: 15.9 % — ABNORMAL HIGH (ref 11.5–15.5)
WBC: 10 10*3/uL (ref 4.0–10.5)
nRBC: 0 % (ref 0.0–0.2)

## 2023-02-22 LAB — RENAL FUNCTION PANEL
Albumin: 2.4 g/dL — ABNORMAL LOW (ref 3.5–5.0)
Anion gap: 11 (ref 5–15)
BUN: 41 mg/dL — ABNORMAL HIGH (ref 8–23)
CO2: 25 mmol/L (ref 22–32)
Calcium: 8.5 mg/dL — ABNORMAL LOW (ref 8.9–10.3)
Chloride: 97 mmol/L — ABNORMAL LOW (ref 98–111)
Creatinine, Ser: 4.67 mg/dL — ABNORMAL HIGH (ref 0.44–1.00)
GFR, Estimated: 9 mL/min — ABNORMAL LOW (ref >60)
Glucose, Bld: 121 mg/dL — ABNORMAL HIGH (ref 70–99)
Phosphorus: 5.3 mg/dL — ABNORMAL HIGH (ref 2.5–4.6)
Potassium: 4.1 mmol/L (ref 3.5–5.1)
Sodium: 133 mmol/L — ABNORMAL LOW (ref 135–145)

## 2023-02-22 LAB — HEPATITIS B SURFACE ANTIGEN

## 2023-02-22 MED ORDER — HYDROCORTISONE 5 MG PO TABS
5.0000 mg | ORAL_TABLET | Freq: Every evening | ORAL | Status: DC
  Administered 2023-02-22: 5 mg via ORAL
  Filled 2023-02-22 (×3): qty 1

## 2023-02-22 MED ORDER — HEPARIN SODIUM (PORCINE) 1000 UNIT/ML IJ SOLN
INTRAMUSCULAR | Status: AC
  Filled 2023-02-22: qty 4

## 2023-02-22 MED ORDER — HYDROCORTISONE 10 MG PO TABS
10.0000 mg | ORAL_TABLET | Freq: Every day | ORAL | Status: DC
  Administered 2023-02-22 – 2023-02-24 (×3): 10 mg via ORAL
  Filled 2023-02-22 (×3): qty 1

## 2023-02-22 MED ORDER — SORBITOL 70 % SOLN: 15.0000 mL | Freq: Every day | Status: DC | PRN

## 2023-02-22 MED ORDER — ALTEPLASE 2 MG IJ SOLR
INTRAMUSCULAR | Status: AC
  Filled 2023-02-22: qty 4

## 2023-02-22 MED ORDER — BISACODYL 10 MG RE SUPP: 10.0000 mg | Freq: Every day | RECTAL | Status: DC | PRN

## 2023-02-22 MED ORDER — OXYCODONE HCL 5 MG PO TABS
5.0000 mg | ORAL_TABLET | ORAL | Status: DC | PRN
  Administered 2023-02-22 – 2023-02-23 (×3): 5 mg via ORAL
  Filled 2023-02-22 (×3): qty 1

## 2023-02-22 MED ORDER — ACETAMINOPHEN 500 MG PO TABS
500.0000 mg | ORAL_TABLET | Freq: Four times a day (QID) | ORAL | Status: AC
  Administered 2023-02-22 – 2023-02-23 (×4): 500 mg via ORAL
  Filled 2023-02-22 (×5): qty 1

## 2023-02-22 MED ORDER — SUCRALFATE 1 GM/10ML PO SUSP
1.0000 g | Freq: Three times a day (TID) | ORAL | Status: DC
  Administered 2023-02-22 – 2023-02-24 (×5): 1 g via ORAL
  Filled 2023-02-22 (×5): qty 10

## 2023-02-22 MED ORDER — ALTEPLASE 2 MG IJ SOLR
INTRAMUSCULAR | Status: AC
  Administered 2023-02-22: 4 mg
  Filled 2023-02-22: qty 4

## 2023-02-22 MED ORDER — APIXABAN 2.5 MG PO TABS
2.5000 mg | ORAL_TABLET | Freq: Two times a day (BID) | ORAL | Status: DC
  Administered 2023-02-22 – 2023-02-24 (×4): 2.5 mg via ORAL
  Filled 2023-02-22 (×4): qty 1

## 2023-02-22 MED ORDER — ALTEPLASE 2 MG IJ SOLR
4.0000 mg | Freq: Once | INTRAMUSCULAR | Status: AC
  Administered 2023-02-22: 4 mg
  Filled 2023-02-22: qty 4

## 2023-02-22 MED ORDER — DARBEPOETIN ALFA 25 MCG/0.42ML IJ SOSY
25.0000 ug | PREFILLED_SYRINGE | INTRAMUSCULAR | Status: DC
  Filled 2023-02-22: qty 0.42

## 2023-02-22 MED ORDER — OXYCODONE HCL 5 MG PO TABS
10.0000 mg | ORAL_TABLET | ORAL | Status: DC | PRN
  Administered 2023-02-24 (×2): 10 mg via ORAL
  Filled 2023-02-22 (×2): qty 2

## 2023-02-22 MED ORDER — ALTEPLASE 2 MG IJ SOLR
4.0000 mg | Freq: Once | INTRAMUSCULAR | Status: AC
  Filled 2023-02-22: qty 4

## 2023-02-22 NOTE — Plan of Care (Signed)
  Problem: Health Behavior/Discharge Planning: Goal: Ability to manage health-related needs will improve Outcome: Progressing   

## 2023-02-22 NOTE — Progress Notes (Signed)
Physical Therapy Treatment Patient Details Name: Kayla Vasquez MRN: [id number] DOB: 11-23-1940 Today's Date: 02/22/2023   History of Present Illness Pt is a 82 y/o F admitted on 02/15/23 after presenting with c/o mechanical fall. Pt found to have trimalleolar fx & developed a-fib with RVR on 02/16/23 requiring Cardizem drip. Pt is s/p ORIF R ankle without fixation of the posterior lip, manual application of stress ankle syndesmosis under fluoroscopy on 02/17/23. PMH: ERD, CA, high cholesterol.    PT Comments    Pt received in supine, agreeable to therapy session at bed level with significant encouragement, session emphasis on improved body mechanics with bed mobility during rolling for bed linen change, UE/LE placement so pt can build strength during rolling/bed repositioning and benefits of BLE ROM/strengthening exercises. Pt with limited tolerance for LE exercises and upright posture due to stated anticipation of pain and fear of falls, RN entering room to give meds at end of session and pt HOB brought to >45* only with heavy encouragement so pt can swallow safely, pt had requested HOB lower. Family entering room and RN present reinforcing aspiration precs at end of session. Pt continues to benefit from PT services to progress toward functional mobility goals, plan to work on lateral scoot vs squat pivot transfer OOB to drop arm chair/wheelchair next session if appropriate.  Recommendations for follow up therapy are one component of a multi-disciplinary discharge planning process, led by the attending physician.  Recommendations may be updated based on patient status, additional functional criteria and insurance authorization.  Follow Up Recommendations  Can patient physically be transported by private vehicle: No    Assistance Recommended at Discharge Frequent or constant Supervision/Assistance  Patient can return home with the following Two people to help with walking and/or transfers;Two people to  help with bathing/dressing/bathroom;Direct supervision/assist for medications management;Assist for transportation;Help with stairs or ramp for entrance;Direct supervision/assist for financial management;Assistance with cooking/housework   Equipment Recommendations  None recommended by PT (TBD post-acute)    Recommendations for Other Services       Precautions / Restrictions Precautions Precautions: Fall Precaution Comments: extreme pain Required Braces or Orthoses: Splint/Cast Splint/Cast: RLE short leg cast Restrictions Weight Bearing Restrictions: Yes RLE Weight Bearing: Non weight bearing     Mobility  Bed Mobility Overal bed mobility: Needs Assistance Bed Mobility: Supine to Sit, Rolling Rolling: Min assist   Supine to sit: HOB elevated, Max assist     General bed mobility comments: increased time and cues throughout, cues for pulling to long sit in bed while repositioning, pt performs minimally with maxA on one trial then defers EOB/OOB due to pain/fatigue. Family and RN entering room at end of session, pt requests pain meds.  X2 rolls in each direction  Transfers                   General transfer comment: pt defers due to pain/fatigue       Balance Overall balance assessment: Needs assistance     Sitting balance - Comments: pt defers EOB/OOB due to pain/fatigue and very anxious, difficult to redirect to attempt.                                    Cognition Arousal/Alertness: Awake/alert Behavior During Therapy: Anxious Overall Cognitive Status: Impaired/Different from baseline Area of Impairment: Orientation, Attention, Memory, Following commands, Awareness, Safety/judgement, Problem solving  Current Attention Level: Focused Memory: Decreased recall of precautions, Decreased short-term memory Following Commands: Follows one step commands consistently, Follows one step commands with increased  time Safety/Judgement: Decreased awareness of deficits Awareness: Intellectual Problem Solving: Decreased initiation, Slow processing, Requires verbal cues, Requires tactile cues, Difficulty sequencing General Comments: HoH, pt anxious in anticipation of pain and often contradictory re: pain scoring/symptoms. Pt appears fearful of exercises/movements. Pt defers EOB/OOB due to pain/fatigue.        Exercises General Exercises - Lower Extremity Short Arc Quad: AAROM, Both, 5 reps, Supine Heel Slides: AAROM, Left, 10 reps, Supine Straight Leg Raises: AAROM, Both, 10 reps, Supine (nearly PROM on RLE despite cues for pt to initiate) Hip Flexion/Marching: Left, 5 reps, Seated (bed chair posture) Other Exercises Other Exercises: IS x 5 reps, pt achieves ~200-400 mL; encouraged hourly use x10 reps if able Other Exercises: bed "crunches" using side rails, pt perform x2 reps but defers more due to pain/fatigue    General Comments General comments (skin integrity, edema, etc.): BP 123/54 with bed in chair posture; HR/SpO2 WFL on 2L O2 [place].      Pertinent Vitals/Pain Pain Assessment Pain Assessment: Faces Faces Pain Scale: Hurts even more Pain Location: RLE with movement Pain Descriptors / Indicators: Grimacing, Guarding, Discomfort Pain Intervention(s): Monitored during session, Repositioned, Limited activity within patient's tolerance, Patient requesting pain meds-RN notified, Other (comment) (pt initially states "it's not that bad" re: RLE pain; after rolling for bed linen change, pt states "it's a lot")     PT Goals (current goals can now be found in the care plan section) Acute Rehab PT Goals Patient Stated Goal: get better, decreased pain PT Goal Formulation: With patient/family Time For Goal Achievement: 03/02/23 Progress towards PT goals: Progressing toward goals (slowly)    Frequency    Min 3X/week      PT Plan Current plan remains appropriate       AM-PAC PT "6 Clicks"  Mobility   Outcome Measure  Help needed turning from your back to your side while in a flat bed without using bedrails?: A Little Help needed moving from lying on your back to sitting on the side of a flat bed without using bedrails?: A Lot Help needed moving to and from a bed to a chair (including a wheelchair)?: Total Help needed standing up from a chair using your arms (e.g., wheelchair or bedside chair)?: Total Help needed to walk in hospital room?: Total Help needed climbing 3-5 steps with a railing? : Total 6 Click Score: 9    End of Session Equipment Utilized During Treatment: Oxygen Activity Tolerance: Patient limited by fatigue;Patient limited by pain Patient left: in bed;with call bell/phone within reach;with bed alarm set;with family/visitor present;Other (comment) (bil heels floated) Nurse Communication: Mobility status;Patient requests pain meds;Precautions (pt c/o L heel pain) PT Visit Diagnosis: Pain;Other abnormalities of gait and mobility (R26.89);Muscle weakness (generalized) (M62.81);Difficulty in walking, not elsewhere classified (R26.2) Pain - Right/Left: Right Pain - part of body: Leg;Ankle and joints of foot     Time: 1703 (PTA out 1709 to 1716 while pt using bed pan, only 1 unit charged)-1726 PT Time Calculation (min) (ACUTE ONLY): 23 min  Charges:  $Therapeutic Exercise: 8-22 mins                     Vidur Knust P., PTA Acute Rehabilitation Services Secure Chat Preferred 9a-5:30pm Office: [phone]    Angus Palms 02/22/2023, 5:53 PM

## 2023-02-22 NOTE — Progress Notes (Signed)
Contacted by CSW with request that pt's out-pt HD clinic be changed to a [facility] clinic. Pt is currently an out-pt HD pt with [facility] clinic. Contacted [facility] and spoke to clinic social worker, Romeo Apple [phone]). Ben advised that pt will be going to [facility] for rehab per CSW and snf requesting a MWF at a [facility] clinic. Ben to check with [facility] and [facility] to see if either clinic has a MWF available. [facility] and [facility] are both part of [facility] system. Will await response from [place].   Olivia Canter Renal Navigator [phone]

## 2023-02-22 NOTE — Progress Notes (Signed)
[facility] Progress Note Patient: Kayla Vasquez WUJ:[id number] DOB: 06/07/1941 DOA: 02/15/2023  DOS: the patient was seen and examined on 02/22/2023  Brief hospital course: PMH of ESRD on HD TTS, HLD, breast cancer, paroxysmal A-fib, GI bleed not on any anticoagulation, nonobstructive CAD, hypothyroidism, adrenal insufficiency, COPD presented to the hospital with complaints of mechanical fall. She was getting into the transport vehicle after her HD and missed a step and fell down injuring her right ankle, right wrist and left arm.  Found to have trimalleolar fracture.  Developed A-fib with RVR on 5/15 requiring Cardizem drip. Having significant issues with pain control.  Developing delirium most likely secondary to psychotropic medications as well as postanesthesia effect. Had issues with her HD catheter requiring exchange. Now medically stable.  Awaiting outpatient SNF arrangements. Assessment and Plan: Trimalleolar right ankle fracture closed. After mechanical fall. Orthopedic consulted. Underwent ORIF of the ankle fracture and manual application of stress ankle syndesmosis. Per orthopedic nonweightbearing right leg for 6 weeks, no ankle motion, splint left in place for 3 weeks. Orthopedic recommend chemical DVT prophylaxis. Patient refusing heparin and Lovenox. Currently on 2.5 mg Eliquis twice daily.   Pain control Continue muscle relaxant.  Scheduled Tylenol as well as as needed Oxy IR. Continue ice and elevation.   Postop delirium. Resolved. Likely secondary to anesthesia effect. TSH B12 folic acid and ammonia level normal. Continue Celexa.  Add melatonin nightly. No focal deficit.  No asterixis.   Paroxysmal A-fib with RVR. Known history of paroxysmal A-fib with RVR preoperatively secondary to pain. Required Cardizem drip. Now on oral Cardizem. Discontinue nifedipine. Continue metoprolol. Limited echocardiogram reassuring with normal EF Not on anticoagulation due to  prior history of GI bleed. Will initiate Eliquis 2.5 mg twice daily and monitor.  History of GI bleed. Extensive discussion with the patient and granddaughter who is an Charity fundraiser with regards to need for DVT prophylaxis.  Patient refusing heparin and Lovenox injections. Currently granddaughter agreeable to initiate Eliquis knowing that that is a risk for bleeding. Initiating 2.5 mg twice daily.   ESRD HD TTS. Nephrology consulted. Continue HD per schedule.  HD catheter required exchange by IR. Currently SNF requesting transitioning her HD to [facility].   Adrenal insufficiency. Patient is on Cortef 5 mg twice daily.  Did not receive any stress dose pre or intraoperatively. Postop stress dose was given. Continue Cortef.  Tapered to 5 mg twice daily home dose.   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 Acute.  Blood loss anemia Baseline hemoglobin around 12.  Dropped postoperatively but remaining stable. No active bleeding seen after initiation of anticoagulation.  Will monitor for now.   Preoperative cardiac evaluation Tolerated surgery without any cardiac issues.   Nonobstructive CAD. Stress test in 2019 was negative for any acute abnormality. Echocardiogram recently showed EF of 60-65%. Repeated this admission and EF remains same Echocardiogram showed no wall motion abnormality or significant valvular abnormality. Monitor for now.   Postop ileus.  Resolved. Constipation Developed abdominal pain on 5/16.  X-ray abdomen shows evidence of possible ileus as well as constipation. Currently initiated on bowel regimen.  Tolerating oral diet. Repeat x-ray abdomen from 5/18 shows improvement.   Hypothyroidism. Continue Synthroid.   Mood disorder. Continue Celexa. Dose was reduced to 20 mg per renal dosing guidelines.  Family reported that the patient has not tolerated Celexa dose reduction in the past and would like to continue 40 mg daily.   HTN. Holding  losartan.  Nifedipine switched to Cardizem.   HLD. Continuing statin.   COPD. No evidence of exacerbation. Continue inhalers.  And nebulizer. Repeat chest x-ray for 5/18 shows no acute abnormality.   Obesity Body mass index is 30.99 kg/m.  Placing the pt at higher risk of poor outcomes.    Stage 1 pressure injury left buttocks  POA Continue foam dressing.    Left extremity restriction. History of PAD Patient does have history of breast cancer and therefore left upper extremity is wrist should be restricted. But also has history of PAD and follows up with vascular surgery and required innominate artery bypass which unfortunately is clotted therefore right upper extremity is not providing accurate blood pressure. Vascular surgery has recommended to use left upper extremity wrist for blood pressure.  Family understand the risk of lymphedema.   Subjective: Pain well-controlled.  No nausea No fever no chills.  Physical Exam: General: in Mild distress, No Rash Cardiovascular: S1 and S2 Present, No Murmur Respiratory: Good respiratory effort, Bilateral Air entry present. No Crackles, No wheezes Abdomen: Bowel Sound present, No tenderness Extremities: No edema Neuro: Alert and oriented x3, no new focal deficit  Data Reviewed: I have Reviewed nursing notes, Vitals, and Lab results. Since last encounter, pertinent lab results CBC and BMP   . I have ordered test including CBC and BMP  . I have discussed pt's care plan and test results with nephrology  .   Disposition: Status is: Inpatient Remains inpatient appropriate because: Need for outpatient HD and SNF.  Medically stable. apixaban (ELIQUIS) tablet 5 mg   Family Communication: No one at bedside Level of care: Progressive   Vitals:   02/22/23 0728 02/22/23 1139 02/22/23 1415 02/22/23 1701  BP: 135/67 (!) 134/52 (!) 126/92 (!) 123/54  Pulse: 100  83   Resp: 18 19 16 20   Temp: 98.1 F (36.7 C) 98 F (36.7 C)  97.6 F (36.4  C)  TempSrc: Axillary Oral  Oral  SpO2: 96%  93%   Weight:      Height:         Author: Lynden Oxford, MD 02/22/2023 6:13 PM  Please look on [email or web address] to find out who is on call.

## 2023-02-22 NOTE — Procedures (Addendum)
HD Note:  Some information was entered later than the data was gathered due to patient care needs. The stated time with the data is accurate.  Received patient in bed to unit.  Alert and oriented.  Informed consent signed and in chart.   Both lumen of the HD catheter placed yesterday did not pull well.  Pushing was also slow.  Completed approximately 1 hour CathFlow dwell prior to treatment.  Access used: Left upper chest HD catheter Access issues were not resolved with the CathFlow dwell period.    Spoke with Dr. Kathe Mariner with new orders to dwell CathFlow until treatment tomorrow.  Transported back to the room  Alert, without acute distress.  Hand-off given to patient's nurse.    Patient catheter placed yesterday:  FINDINGS: New catheter tip in the right [facility].   IMPRESSION: Successful exchange of the tunneled dialysis catheter with fluoroscopy.     Electronically Signed   By: Richarda Overlie M.D.   On: 02/21/2023 15:10   [facility]

## 2023-02-22 NOTE — TOC Progression Note (Signed)
Transition of Care [facility]) - Progression Note    Patient Details  Name: Kayla Vasquez MRN: [id number] Date of Birth: 1940/10/15  Transition of Care [facility]) CM/SW Contact  Eduard Roux, [place] Phone Number: 02/22/2023, 10:22 AM  Clinical Narrative:     CSW spoke with patient's daughter, she requested to re-send referral to [facility]- referral was sent as requested. CSW called [facility]- left voice message to return call.      Expected Discharge Plan: Skilled Nursing Facility Barriers to Discharge: Continued Medical Work up  Expected Discharge Plan and Services In-house Referral: Clinical Social Work     Living arrangements for the past 2 months: Single Family Home                                       Social Determinants of Health (SDOH) Interventions SDOH Screenings   Tobacco Use: Low Risk  (02/22/2023)    Readmission Risk Interventions     No data to display

## 2023-02-22 NOTE — Discharge Instructions (Signed)

## 2023-02-22 NOTE — Progress Notes (Signed)
Patient ID: Kayla Vasquez, female   DOB: 1940/11/12, 82 y.o.   MRN: [id number] [facility] Progress Note   Assessment/ Plan:   1.  Status post fall with trimalleolar right ankle fracture: Underwent open reduction and internal fixation by orthopedic surgery on 02/17/2023. 2. ESRD: On TTS hemodialysis schedule as an outpatient with dialysis done yesterday after successful left IJ TDC exchange by IR.  Will place orders for truncated hemodialysis again today to get her back on the schedule. 3. Anemia: Postoperative drop noted, status post ESA on Saturday.  No overt blood loss noted. 4. CKD-MBD: Calcium level acceptable with ongoing Zemplar for PTH control.  Currently not on binder. 5.  Atrial fibrillation: Currently rate controlled on diltiazem and metoprolol.  Not on anticoagulation due to history of GI bleed. 6.  Adrenal insufficiency: On supplementation with hydrocortisone with plans noted for taper down to maintenance from postoperative stress dosing.  Subjective:   Confused about events of yesterday likely due to sedation for [facility] exchange.  Has received some SNF bed offers.   Objective:   BP 135/67 (BP Location: Left Wrist)   Pulse 100   Temp 98.1 F (36.7 C) (Axillary)   Resp 18   Ht 5\' 6"  (1.676 m)   Wt 82.8 kg   SpO2 96%   BMI 29.46 kg/m   Physical Exam: Gen: Comfortably resting in bed, awake/alert CVS: Pulse regular rhythm, normal rate, S1 and S2 normal.  Left IJ TDC with bloodstained dressing Resp: Fine rales left base otherwise clear to auscultation.  No distinct wheeze.  On oxygen via [place] Abd: Soft, flat, nontender, bowel sounds normal Ext: Right leg surgical dressing is intact.  Trace pedal edema  Labs: BMET Recent Labs  Lab 02/15/23 1804 02/17/23 0210 02/18/23 0159 02/19/23 0135 02/20/23 1002 02/22/23 0137  NA 136 132* 133* 133* 134* 133*  K 5.4* 5.3* 3.8 4.4 4.4 4.1  CL 101 94* 93* 95* 97* 97*  CO2 25 24 28 24 23 25   GLUCOSE 81 155* 130* 119* 111*  121*  BUN 21 48* 24* 42* 51* 41*  CREATININE 2.36* 5.07* 3.54* 5.24* 6.00* 4.67*  CALCIUM 8.6* 8.7* 8.2* 8.5* 8.7* 8.5*  PHOS  --   --  5.3*  --   --  5.3*   CBC Recent Labs  Lab 02/15/23 1804 02/17/23 0210 02/19/23 0135 02/20/23 1002 02/21/23 0205 02/22/23 0137  WBC 9.0   < > 10.6* 12.0* 12.5* 10.0  NEUTROABS 6.3  --   --  9.1* 9.5*  --   HGB 12.2   < > 9.4* 9.7* 9.7* 9.6*  HCT 41.5   < > 31.3* 33.2* 32.6* 31.9*  MCV 104.3*   < > 102.6* 103.4* 101.6* 100.9*  PLT 183   < > 183 225 216 211   < > = values in this interval not displayed.      Medications:     acetaminophen  500 mg Oral QID   apixaban  5 mg Oral BID   bisacodyl  10 mg Rectal Daily   Chlorhexidine Gluconate Cloth  6 each Topical Q0600   citalopram  40 mg Oral Daily   darbepoetin (ARANESP) injection - DIALYSIS  25 mcg Subcutaneous Q Mon-1800   diltiazem  120 mg Oral Daily   feeding supplement (NEPRO CARB STEADY)  237 mL Oral TID BM   fluticasone furoate-vilanterol  1 puff Inhalation Daily   hydrocortisone  10 mg Oral Daily   hydrocortisone  5 mg Oral QPM  insulin aspart  0-5 Units Subcutaneous QHS   insulin aspart  0-9 Units Subcutaneous TID WC   levothyroxine  25 mcg Oral Q0600   melatonin  5 mg Oral QHS   metoprolol succinate  25 mg Oral QHS   multivitamin with minerals  1 tablet Oral QPM   paricalcitol  3.5 mcg Intravenous Once per day on Mon Wed Fri   senna-docusate  2 tablet Oral BID   umeclidinium bromide  1 puff Inhalation Daily   Zetta Bills, MD 02/22/2023, 9:34 AM

## 2023-02-22 NOTE — Progress Notes (Signed)
PT Cancellation Note  Patient Details Name: Sascha Dismukes MRN: [id number] DOB: Jun 06, 1941   Cancelled Treatment:    Reason Eval/Treat Not Completed: (P) Patient at procedure or test/unavailable (Attempted 1410, pt at HD dept.). Other PTA attempted at 1545, pt not yet back to room. Will continue efforts per PT plan of care as schedule permits.   Dorathy Kinsman Bhavin Monjaraz 02/22/2023, 3:51 PM

## 2023-02-22 NOTE — TOC Progression Note (Signed)
Transition of Care [facility]) - Progression Note    Patient Details  Name: Kayla Vasquez MRN: [id number] Date of Birth: 04/16/1941  Transition of Care [facility]) CM/SW Contact  Eduard Roux, [place] Phone Number: 02/22/2023, 1:08 PM  Clinical Narrative:     [name] made offer - requested temp HD clinic be in [place], MWF- sent secured chat to Renal Navigator, patient has secured bed. Navigator will notify once outpatient HD clinic has been secured.  TOC will continue to follow and assist with discharge planning.   Antony Blackbird, MSW, LCSW Clinical Social Worker     Expected Discharge Plan: Skilled Nursing Facility Barriers to Discharge: Continued Medical Work up  Expected Discharge Plan and Services In-house Referral: Clinical Social Work     Living arrangements for the past 2 months: Single Family Home                                       Social Determinants of Health (SDOH) Interventions SDOH Screenings   Tobacco Use: Low Risk  (02/22/2023)    Readmission Risk Interventions     No data to display

## 2023-02-23 DIAGNOSIS — S82851A Displaced trimalleolar fracture of right lower leg, initial encounter for closed fracture: Secondary | ICD-10-CM | POA: Diagnosis not present

## 2023-02-23 LAB — GLUCOSE, CAPILLARY
Glucose-Capillary: 73 mg/dL (ref 70–99)
Glucose-Capillary: 124 mg/dL — ABNORMAL HIGH (ref 70–99)
Glucose-Capillary: 108 mg/dL — ABNORMAL HIGH (ref 70–99)
Glucose-Capillary: 80 mg/dL (ref 70–99)

## 2023-02-23 LAB — CBC
HCT: 33.4 % — ABNORMAL LOW (ref 36.0–46.0)
Hemoglobin: 10 g/dL — ABNORMAL LOW (ref 12.0–15.0)
MCH: 29.9 pg (ref 26.0–34.0)
MCHC: 29.9 g/dL — ABNORMAL LOW (ref 30.0–36.0)
MCV: 100 fL (ref 80.0–100.0)
Platelets: 231 10*3/uL (ref 150–400)
RBC: 3.34 MIL/uL — ABNORMAL LOW (ref 3.87–5.11)
RDW: 16.1 % — ABNORMAL HIGH (ref 11.5–15.5)
WBC: 9.1 10*3/uL (ref 4.0–10.5)
nRBC: 0 % (ref 0.0–0.2)

## 2023-02-23 LAB — RENAL FUNCTION PANEL
Albumin: 2.4 g/dL — ABNORMAL LOW (ref 3.5–5.0)
Anion gap: 15 (ref 5–15)
BUN: 59 mg/dL — ABNORMAL HIGH (ref 8–23)
CO2: 24 mmol/L (ref 22–32)
Calcium: 9.2 mg/dL (ref 8.9–10.3)
Chloride: 95 mmol/L — ABNORMAL LOW (ref 98–111)
Creatinine, Ser: 5.5 mg/dL — ABNORMAL HIGH (ref 0.44–1.00)
GFR, Estimated: 7 mL/min — ABNORMAL LOW (ref >60)
Glucose, Bld: 98 mg/dL (ref 70–99)
Phosphorus: 6.2 mg/dL — ABNORMAL HIGH (ref 2.5–4.6)
Potassium: 4.2 mmol/L (ref 3.5–5.1)
Sodium: 134 mmol/L — ABNORMAL LOW (ref 135–145)

## 2023-02-23 MED ORDER — CYCLOBENZAPRINE HCL 10 MG PO TABS
5.0000 mg | ORAL_TABLET | Freq: Three times a day (TID) | ORAL | Status: DC
  Administered 2023-02-23 – 2023-02-24 (×4): 5 mg via ORAL
  Filled 2023-02-23 (×4): qty 1

## 2023-02-23 MED ORDER — SEVELAMER CARBONATE 800 MG PO TABS
1600.0000 mg | ORAL_TABLET | Freq: Three times a day (TID) | ORAL | Status: DC
  Administered 2023-02-23 – 2023-02-24 (×2): 1600 mg via ORAL
  Filled 2023-02-23 (×2): qty 2

## 2023-02-23 MED ORDER — DARBEPOETIN ALFA 25 MCG/0.42ML IJ SOSY
25.0000 ug | PREFILLED_SYRINGE | INTRAMUSCULAR | Status: DC
  Administered 2023-02-23: 25 ug via SUBCUTANEOUS
  Filled 2023-02-23: qty 0.42

## 2023-02-23 NOTE — Progress Notes (Signed)
Patient ID: Kayla Vasquez, female   DOB: 10-06-1940, 82 y.o.   MRN: [id number]  [facility] Progress Note   Assessment/ Plan:   1.  Status post fall with trimalleolar right ankle fracture: Underwent open reduction and internal fixation by orthopedic surgery on 02/17/2023. 2. ESRD: On TTS hemodialysis schedule as an outpatient with dialysis done off schedule on Monday 5/22 after left IJ TDC exchange by IR.  I had ordered for a truncated dialysis session yesterday however, this could not be done because of catheter malfunction again (that did not improve with instillation of tPA for 1 hour).  She currently has tPA indwelling in her catheter since yesterday and the plan is to reattempt use today to assess catheter patency. 3. Anemia: Postoperative drop noted, status post ESA on Saturday.  No overt blood loss noted. 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  Will begin sevelamer today for phosphorus binding. 5.  Atrial fibrillation: Currently rate controlled on diltiazem and metoprolol.  Not on anticoagulation due to history of GI bleed. 6.  Adrenal insufficiency: On supplementation with hydrocortisone with plans noted for taper down to maintenance from postoperative stress dosing.  Subjective:   Reports to be feeling "fair" with right lower extremity discomfort and fatigue from being in the hospital.   Objective:   BP (!) 138/58 (BP Location: Left Wrist)   Pulse 88   Temp 97.6 F (36.4 C) (Oral)   Resp 16   Ht 5\' 6"  (1.676 m)   Wt 82.4 kg   SpO2 96%   BMI 29.32 kg/m   Physical Exam: Gen: Awake and alert, resting comfortably in bed CVS: Pulse regular rhythm, normal rate, S1 and S2 normal.  Left IJ TDC with intact dressing Resp: Fine rales left base otherwise clear to auscultation.  No distinct wheeze.  On oxygen via [place] Abd: Soft, flat, nontender, bowel sounds normal Ext: Right leg dressing intact and propped up on pillows.  Trace left leg  pedal edema  Labs: BMET Recent Labs  Lab 02/17/23 0210 02/18/23 0159 02/19/23 0135 02/20/23 1002 02/22/23 0137 02/23/23 0300  NA 132* 133* 133* 134* 133* 134*  K 5.3* 3.8 4.4 4.4 4.1 4.2  CL 94* 93* 95* 97* 97* 95*  CO2 24 28 24 23 25 24   GLUCOSE 155* 130* 119* 111* 121* 98  BUN 48* 24* 42* 51* 41* 59*  CREATININE 5.07* 3.54* 5.24* 6.00* 4.67* 5.50*  CALCIUM 8.7* 8.2* 8.5* 8.7* 8.5* 9.2  PHOS  --  5.3*  --   --  5.3* 6.2*   CBC Recent Labs  Lab 02/20/23 1002 02/21/23 0205 02/22/23 0137 02/23/23 0300  WBC 12.0* 12.5* 10.0 9.1  NEUTROABS 9.1* 9.5*  --   --   HGB 9.7* 9.7* 9.6* 10.0*  HCT 33.2* 32.6* 31.9* 33.4*  MCV 103.4* 101.6* 100.9* 100.0  PLT 225 216 211 231      Medications:     acetaminophen  500 mg Oral QID   apixaban  2.5 mg Oral BID   Chlorhexidine Gluconate Cloth  6 each Topical Q0600   citalopram  40 mg Oral Daily   cyclobenzaprine  5 mg Oral TID   darbepoetin (ARANESP) injection - DIALYSIS  25 mcg Subcutaneous Q Tue-1800   diltiazem  120 mg Oral Daily   feeding supplement (NEPRO CARB STEADY)  237 mL Oral TID BM   fluticasone furoate-vilanterol  1 puff Inhalation Daily   hydrocortisone  10 mg Oral Daily   hydrocortisone  5 mg Oral QPM   insulin aspart  0-5 Units Subcutaneous QHS   insulin aspart  0-9 Units Subcutaneous TID WC   levothyroxine  25 mcg Oral Q0600   melatonin  5 mg Oral QHS   metoprolol succinate  25 mg Oral QHS   multivitamin with minerals  1 tablet Oral QPM   paricalcitol  3.5 mcg Intravenous Once per day on Mon Wed Fri   senna-docusate  2 tablet Oral BID   sucralfate  1 g Oral TID WC & HS   umeclidinium bromide  1 puff Inhalation Daily   Zetta Bills, MD 02/23/2023, 9:31 AM

## 2023-02-23 NOTE — TOC Initial Note (Signed)
Transition of Care [facility]) - Initial/Assessment Note    Patient Details  Name: Kayla Vasquez MRN: [id number] Date of Birth: 1941/09/14  Transition of Care [facility]) CM/SW Contact:    Eduard Roux, LCSW Phone Number: 02/23/2023, 2:16 PM  Clinical Narrative:                  Renal Navigator secured dialysis cent at [facility], MWF @ 11:45 am.   Informed [name] of anticipated d/c tomorrow   Antony Blackbird, MSW, LCSW Clinical Social Worker    Expected Discharge Plan: Skilled Nursing Facility Barriers to Discharge: Continued Medical Work up   Patient Goals and CMS Choice Patient states their goals for this hospitalization and ongoing recovery are:: Get stronger CMS Medicare.gov Compare Post Acute Care list provided to:: Patient Represenative (must comment) (daughter) Choice offered to / list presented to : Adult Children [place] ownership interest in [facility].provided to:: Adult Children    Expected Discharge Plan and Services In-house Referral: Clinical Social Work     Living arrangements for the past 2 months: Single Family Home                                      Prior Living Arrangements/Services Living arrangements for the past 2 months: Single Family Home Lives with:: Self Patient language and need for interpreter reviewed:: Yes Do you feel safe going back to the place where you live?: Yes      Need for Family Participation in Patient Care: Yes (Comment) Care giver support system in place?: Yes (comment)   Criminal Activity/Legal Involvement Pertinent to Current Situation/Hospitalization: No - Comment as needed  Activities of Daily Living      Permission Sought/Granted Permission sought to share information with : Case Manager, Magazine features editor, Family Supports Permission granted to share information with : No (Pt confused)  Share Information with NAME: Melody Holder  Permission granted to share info  w AGENCY: SNF  Permission granted to share info w Relationship: Daughter  Permission granted to share info w Contact Information: [phone]  Emotional Assessment       Orientation: : Fluctuating Orientation (Suspected and/or reported Sundowners) Alcohol / Substance Use: Not Applicable Psych Involvement: No (comment)  Admission diagnosis:  ESRD on hemodialysis (HCC) [N18.6, Z99.2] Closed trimalleolar fracture of right ankle, initial encounter [S82.851A] Hematoma of arm, left, initial encounter [S40.022A] Unable to ambulate [R26.2] Hematoma of right wrist [S60.211A] Patient Active Problem List   Diagnosis Date Noted   End-stage renal disease on hemodialysis (HCC) 02/16/2023   Essential hypertension 02/16/2023   Hypothyroidism 02/16/2023   COPD without exacerbation (HCC) 02/16/2023   Dyslipidemia 02/16/2023   Adrenal insufficiency (HCC) 02/16/2023   Type 2 diabetes mellitus with chronic kidney disease, without long-term current use of insulin (HCC) 02/16/2023   ESRD on hemodialysis (HCC) 02/16/2023   Trimalleolar fracture of ankle, closed, right, initial encounter 02/16/2023   Unable to ambulate 02/15/2023   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   History of breast cancer 08/22/2018   Hyperlipidemia 08/22/2018   Adrenal crisis (HCC) 08/21/2018   PCP:  Oneita Hurt No Pharmacy:   [facility] [address], [place] - [address] [place] [address] Phone: [phone] Fax: [phone]  [facility] - [place], [place] - [address] [place] [address] Phone: [phone] Fax: [phone]  Social Determinants of Health (SDOH) Social History: SDOH Screenings   Tobacco Use: Low Risk  (02/22/2023)   SDOH Interventions:     Readmission Risk Interventions     No data to display

## 2023-02-23 NOTE — Progress Notes (Addendum)
Spoke to [place] with [facility] this morning. [facility] has a MWF 11:45 am chair time available if snf can transport to that clinic at that time. Contacted CSW who contacted snf who reported that they can transport to [facility] on MWF 11:45 chair time. CSW reports pt for possible d/c tomorrow. Cephus Shelling, clinic social worker, to advise him of pt's possible d/c tomorrow and need to start at [facility] on Friday. Awaiting a return call to see if that is possible. Ben advised that snf is able to transport pt to [facility] on MWF. Update provided to nephrologist as well. Will assist as needed.   Olivia Canter Renal Navigator [phone]  Addendum at 2:54 pm: Spoke to [place] and pt can start at [facility] on Friday if pt is d/c tomorrow. Update provided to CSW.

## 2023-02-23 NOTE — Progress Notes (Signed)
Occupational Therapy Treatment Patient Details Name: Kayla Vasquez MRN: [id number] DOB: 09-12-1941 Today's Date: 02/23/2023   History of present illness Pt is a 82 y/o F admitted on 02/15/23 after presenting with c/o mechanical fall. Pt found to have trimalleolar fx & developed a-fib with RVR on 02/16/23 requiring Cardizem drip. Pt is s/p ORIF R ankle without fixation of the posterior lip, manual application of stress ankle syndesmosis under fluoroscopy on 02/17/23. PMH: ERD, CA, high cholesterol.   OT comments  Pt in bed upon therapy arrival. Agreeable to participate in therapy session although verbalized anxiety about experiencing pain and falling. Pt provided with reassurance during session and demonstrated improved functional performance. Able to transition to sitting on EOB and stand with  2 person assist. Unable to transfer to [facility] due to BP changes. Pt verbalized feeling dizzy/lightheaded and demonstrated a change in skin color as well as being cold and clammy. Pt was returned to supine and placed in Trendelenburg position until BP returned to normal levels. Assisted pt with use of bedpan. Pt continues to be appropriate for  inpatient follow up therapy, <3 hours/day. OT will continue to follow patient acutely.      Recommendations for follow up therapy are one component of a multi-disciplinary discharge planning process, led by the attending physician.  Recommendations may be updated based on patient status, additional functional criteria and insurance authorization.    Assistance Recommended at Discharge Frequent or constant Supervision/Assistance  Patient can return home with the following  Two people to help with walking and/or transfers;A lot of help with bathing/dressing/bathroom;Assistance with cooking/housework;Direct supervision/assist for medications management;Assist for transportation;Help with stairs or ramp for entrance;Direct supervision/assist for financial management;Assistance with  feeding   Equipment Recommendations  Other (comment) (defer to next venue of care)       Precautions / Restrictions Precautions Precautions: Fall Precaution Comments: fear of falling. Watch BP! Required Braces or Orthoses: Splint/Cast Splint/Cast: RLE short leg cast Restrictions Weight Bearing Restrictions: Yes RLE Weight Bearing: Non weight bearing       Mobility Bed Mobility Overal bed mobility: Needs Assistance Bed Mobility: Rolling, Sidelying to Sit, Sit to Supine Rolling: Min assist Sidelying to sit: Min assist, +2 for physical assistance, HOB elevated Supine to sit: Total assist, +2 for physical assistance (due to BP drop)     General bed mobility comments: VC provided for technique and use of hand rails to assist with all bed mobility. Patient Response: Cooperative, Anxious  Transfers Overall transfer level: Needs assistance Equipment used: Rolling walker (2 wheels) Transfers: Sit to/from Stand Sit to Stand: Mod assist, +2 physical assistance           General transfer comment: Completed 1 sit to stand with 2 person face to face technique and RW. VC provided for hand placement with RW management. VC provided to tuck hips/squeeze glutes in order to achieve an upright standing posture. Able to tolerate for ~1- seconds before requesting to sit.     Balance Overall balance assessment: Needs assistance Sitting-balance support: Feet supported, Bilateral upper extremity supported Sitting balance-Leahy Scale: Fair Sitting balance - Comments: sitting EOB     Standing balance-Leahy Scale: Poor         ADL either performed or assessed with clinical judgement      Cognition Arousal/Alertness: Awake/alert Behavior During Therapy: Anxious Overall Cognitive Status:  (alternating cognition due to medical complications)    General Comments: Initially cognition was [facility] for tasks assessed. Once seated on EOB, pt reported feeling dizzy/lightheaded with  change in color.  After pt was returned supine, she verbalized some visual hallucinations although was oriented to person, place, situation,and time. Nursing was made aware.              General Comments BP monitored during sessionl. Supine: 151/84, Supine after sitting and standing at EOB: 120/71, Supine (after 2-3 minutes) 112/58    Pertinent Vitals/ Pain       Pain Assessment Pain Assessment: Faces Faces Pain Scale: Hurts little more Pain Location: RLE with movement Pain Descriptors / Indicators: Grimacing, Guarding Pain Intervention(s): Limited activity within patient's tolerance, Monitored during session, Repositioned, Premedicated before session         Frequency  Min 1X/week        Progress Toward Goals  OT Goals(current goals can now be found in the care plan section)  Progress towards OT goals: Progressing toward goals     Plan Discharge plan remains appropriate;Frequency remains appropriate    Co-evaluation    PT/OT/SLP Co-Evaluation/Treatment: Yes Reason for Co-Treatment: Complexity of the patient's impairments (multi-system involvement);For patient/therapist safety;To address functional/ADL transfers   OT goals addressed during session: ADL's and self-care;Strengthening/ROM;Proper use of Adaptive equipment and DME      AM-PAC OT "6 Clicks" Daily Activity     Outcome Measure   Help from another person eating meals?: A Lot Help from another person taking care of personal grooming?: A Lot Help from another person toileting, which includes using toliet, bedpan, or urinal?: Total Help from another person bathing (including washing, rinsing, drying)?: Total Help from another person to put on and taking off regular upper body clothing?: A Lot Help from another person to put on and taking off regular lower body clothing?: Total 6 Click Score: 9    End of Session Equipment Utilized During Treatment: Gait belt;Oxygen  OT Visit Diagnosis: History of falling  (Z91.81);Unsteadiness on feet (R26.81);Muscle weakness (generalized) (M62.81);Other abnormalities of gait and mobility (R26.89);Pain Pain - Right/Left: Right Pain - part of body: Leg   Activity Tolerance Treatment limited secondary to medical complications (Comment)   Patient Left in bed;with call bell/phone within reach;with bed alarm set   Nurse Communication Mobility status;Other (comment) (BP with positional changes)        Time: 1610-9604 OT Time Calculation (min): 41 min  Charges: OT General Charges $OT Visit: 1 Visit OT Treatments $Therapeutic Activity: 23-37 mins  Limmie Patricia, OTR/L,CBIS  Supplemental OT - [facility] and [facility] Secure Chat Preferred    Violet Cart, Charisse March 02/23/2023, 1:43 PM

## 2023-02-23 NOTE — Progress Notes (Signed)
Physical Therapy Treatment Patient Details Name: Kayla Vasquez MRN: [id number] DOB: [birth year] Today's Date: 02/23/2023   History of Present Illness Pt is a 82 y/o F admitted on 02/15/23 after presenting with c/o mechanical fall. Pt found to have trimalleolar fx & developed a-fib with RVR on 02/16/23 requiring Cardizem drip. Pt is s/p ORIF R ankle without fixation of the posterior lip, manual application of stress ankle syndesmosis under fluoroscopy on 02/17/23. PMH: ERD, CA, high cholesterol.    PT Comments    Patient resting in supine at start of session and reports pain well controlled and agreeable to therapy with PT and OT. Min-Mod +2 assist provided for supine>sit EOB and pt reported slight dizziness but BP stable. Cues provided for precautions/WB restriction and +2 Mod assist for sit<>stand with RW at EOB. Pt able to maintain NWB on Rt LE with good use of Ue's and Lt LE for rise. Pt stood <5 seconds and requested return to sit due to weakness. After sitting ~10 seconds pt report faint sensation and noted to be clammy and pale. Total assist for return to supine and pt placed in trendelenburg position of bed. BP continued to decrease but recovered after ~8 minutes and pt then assisted to roll in bed for bed pan placement. Sacral cover removed as it was saturated in urine and skin noted to be red and macerated underneath. Skin cleansed and barrier cream applied and new clean sacral cover. RN notified. EOS pt placed in chair position in bed, Alarm on and call bell within reach. Will continue to progress as able.  BP monitored during session Sitting EOB  151/84 mmHG  Supine/Trendelenburg following Total assist transfer after Sit<>Stand 120/71 mmHG  Supine (after 2-3 minutes)  112/58 mmHG     Recommendations for follow up therapy are one component of a multi-disciplinary discharge planning process, led by the attending physician.  Recommendations may be updated based on patient status, additional  functional criteria and insurance authorization.  Follow Up Recommendations  Can patient physically be transported by private vehicle: No    Assistance Recommended at Discharge Frequent or constant Supervision/Assistance  Patient can return home with the following Two people to help with walking and/or transfers;Two people to help with bathing/dressing/bathroom;Direct supervision/assist for medications management;Assist for transportation;Help with stairs or ramp for entrance;Direct supervision/assist for financial management;Assistance with cooking/housework   Equipment Recommendations   (TBD at next venue)    Recommendations for Other Services OT consult     Precautions / Restrictions Precautions Precautions: Fall Precaution Comments: fear of falling. Watch BP! Required Braces or Orthoses: Splint/Cast Splint/Cast: RLE short leg cast Restrictions Weight Bearing Restrictions: Yes RLE Weight Bearing: Non weight bearing     Mobility  Bed Mobility Overal bed mobility: Needs Assistance Bed Mobility: Rolling, Sidelying to Sit, Sit to Supine Rolling: Min assist Sidelying to sit: Min assist, Mod assist, +2 for physical assistance, HOB elevated Supine to sit: Total assist, +2 for physical assistance, +2 for safety/equipment (due to BP drop)     General bed mobility comments: VC provided for technique and use of hand rails to assist with all bed mobility. Pt completed supine>sit EOB with min-mod +2 assist to fully raise trunk and bring Rt LE off EOB. Total assist for return to supine due to symptoms of dizziness/faint sensation following sit<>stand. pt completed rolling for bed pan and bed pad change at EOS with min assist and cues for bed rail.    Transfers Overall transfer level: Needs assistance Equipment used: Rolling walker (  2 wheels) Transfers: Sit to/from Stand Sit to Stand: Mod assist, +2 physical assistance           General transfer comment: 1x sit<>stand from  slightly elevated EOB with +2 Mod assist and RW. VC provided to tuck hips/squeeze glutes in order to achieve an upright standing posture. Able to tolerate for <5 seconds before requesting to sit. Pt sat EOB ~10 seconds and reported feeling faint and dizzy.    Ambulation/Gait                   Stairs             Wheelchair Mobility    Modified Rankin (Stroke Patients Only)       Balance Overall balance assessment: Needs assistance Sitting-balance support: Feet supported, Bilateral upper extremity supported Sitting balance-Leahy Scale: Fair Sitting balance - Comments: sitting EOB     Standing balance-Leahy Scale: Poor                              Cognition Arousal/Alertness: Awake/alert Behavior During Therapy: Anxious Overall Cognitive Status: Impaired/Different from baseline                                 General Comments: pt anxious regarding mobility. Cognition WFL's at start and pt following cues/commands well. Pt then became confused after reporting dizziness and feeling faint following sit<>stand at EOB. Pt hallucinating "water is shooting up from the floor" once repositioned in supine/trendelenburg. pt became more alert and more cognitively aware as BP improved.        Exercises      General Comments General comments (skin integrity, edema, etc.): BP monitored during sessionl. Supine: 151/84, Supine after sitting and standing at EOB: 120/71, Supine (after 2-3 minutes) 112/58      Pertinent Vitals/Pain Pain Assessment Pain Assessment: Faces Faces Pain Scale: Hurts little more Pain Descriptors / Indicators: Guarding Pain Intervention(s): Limited activity within patient's tolerance, Monitored during session, Repositioned    Home Living                          Prior Function            PT Goals (current goals can now be found in the care plan section) Acute Rehab PT Goals Patient Stated Goal: get better,  decreased pain PT Goal Formulation: With patient/family Time For Goal Achievement: 03/02/23 Potential to Achieve Goals: Fair Progress towards PT goals: Progressing toward goals    Frequency    Min 3X/week      PT Plan Current plan remains appropriate    Co-evaluation PT/OT/SLP Co-Evaluation/Treatment: Yes Reason for Co-Treatment: To address functional/ADL transfers;For patient/therapist safety PT goals addressed during session: Mobility/safety with mobility;Strengthening/ROM;Proper use of DME;Balance OT goals addressed during session: ADL's and self-care;Strengthening/ROM;Proper use of Adaptive equipment and DME      AM-PAC PT "6 Clicks" Mobility   Outcome Measure  Help needed turning from your back to your side while in a flat bed without using bedrails?: A Little Help needed moving from lying on your back to sitting on the side of a flat bed without using bedrails?: A Lot Help needed moving to and from a bed to a chair (including a wheelchair)?: Total Help needed standing up from a chair using your arms (e.g., wheelchair or bedside chair)?: A Lot Help needed  to walk in hospital room?: Total Help needed climbing 3-5 steps with a railing? : Total 6 Click Score: 10    End of Session Equipment Utilized During Treatment: Gait belt;Oxygen Activity Tolerance: Patient tolerated treatment well;Treatment limited secondary to medical complications (Comment) (orthostatic.) Patient left: in bed;with call bell/phone within reach;with bed alarm set Nurse Communication: Mobility status PT Visit Diagnosis: Pain;Other abnormalities of gait and mobility (R26.89);Muscle weakness (generalized) (M62.81);Difficulty in walking, not elsewhere classified (R26.2) Pain - Right/Left: Right Pain - part of body: Leg;Ankle and joints of foot     Time: 1610-9604 PT Time Calculation (min) (ACUTE ONLY): 42 min  Charges:  $Therapeutic Activity: 8-22 mins                     Wynn Maudlin, DPT Acute  Rehabilitation Services Office [phone]  02/23/23 4:29 PM

## 2023-02-23 NOTE — Progress Notes (Signed)
PROGRESS NOTE    Kayla Vasquez  ZOX:[id number] DOB: [birth year] DOA: 02/15/2023 PCP: Pcp, No   Brief Narrative:  PMH of ESRD on HD TTS, HLD, breast cancer, paroxysmal A-fib, GI bleed not on any anticoagulation, nonobstructive CAD, hypothyroidism, adrenal insufficiency, COPD presented to the hospital with complaints of mechanical fall. She was getting into the transport vehicle after her HD and missed a step and fell down injuring her right ankle, right wrist and left arm.  Found to have trimalleolar fracture.  Developed A-fib with RVR on 5/15 requiring Cardizem drip. Having significant issues with pain control.  Developing delirium most likely secondary to psychotropic medications as well as postanesthesia effect. Had issues with her HD catheter requiring exchange. Now medically stable.  Awaiting outpatient SNF arrangements.  Assessment & Plan:   Principal Problem:   Trimalleolar fracture of ankle, closed, right, initial encounter Active Problems:   Unable to ambulate   End-stage renal disease on hemodialysis (HCC)   Type 2 diabetes mellitus with chronic kidney disease, without long-term current use of insulin (HCC)   Hypothyroidism   Essential hypertension   COPD without exacerbation (HCC)   Dyslipidemia   Adrenal insufficiency (HCC)   ESRD on hemodialysis (HCC)  Trimalleolar right ankle fracture closed After mechanical fall. Orthopedic consulted. Underwent ORIF of the ankle fracture and manual application of stress ankle syndesmosis. Per orthopedic nonweightbearing right leg for 6 weeks, no ankle motion, splint left in place for 3 weeks. Orthopedic recommend chemical DVT prophylaxis. Currently on 2.5 mg Eliquis twice daily.  Patient's main issue has been pain control.  She is still complaining of severe pain.  She is on appropriate opioid medications oxycodone 5 to 10 mg every 4 hours as needed.  She is also on Tylenol as needed.  I will start her on Flexeril scheduled.   Postop  delirium. Resolved.   Paroxysmal A-fib with RVR. Known history of paroxysmal A-fib with RVR preoperatively secondary to pain. Required Cardizem drip. Now on oral Cardizem. Discontinued nifedipine. Continue metoprolol. Limited echocardiogram reassuring with normal EF Not on anticoagulation due to prior history of GI bleed. Has now been started on Eliquis 2.5 mg twice daily and monitor after discussion with the daughter by previous hospitalist.   History of GI bleed. Previous hospitalist had extensive discussion with the patient and granddaughter who is an Charity fundraiser with regards to need for DVT prophylaxis.  Patient refusing heparin and Lovenox injections. Currently granddaughter agreeable to initiate Eliquis knowing that that is a risk for bleeding.   ESRD HD TTS. Nephrology consulted. Continue HD per schedule.  HD catheter required exchange by IR. Currently SNF requesting transitioning her HD to [facility].   Adrenal insufficiency. Patient is on Cortef 5 mg twice daily.  Did not receive any stress dose pre or intraoperatively. Postop stress dose was given. Continue Cortef.  Tapered to 5 mg twice daily home dose.   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 Acute.  Blood loss anemia Baseline hemoglobin around 12.  Dropped postoperatively but remaining stable. No active bleeding seen after initiation of anticoagulation.  Will monitor for now.   Preoperative cardiac evaluation Tolerated surgery without any cardiac issues.   Nonobstructive CAD. Stress test in 2019 was negative for any acute abnormality. Echocardiogram recently showed EF of 60-65%. Repeated this admission and EF remains same Echocardiogram showed no wall motion abnormality or significant valvular abnormality. Monitor for now.   Postop ileus.  Resolved. Constipation Developed abdominal pain on 5/16.  X-ray abdomen shows evidence of possible ileus as well as constipation. Currently initiated on  bowel regimen.  Tolerating oral diet. Repeat x-ray abdomen from 5/18 shows improvement.   Hypothyroidism. Continue Synthroid.   Mood disorder. Continue Celexa. Dose was reduced to 20 mg per renal dosing guidelines.  Family reported that the patient has not tolerated Celexa dose reduction in the past and would like to continue 40 mg daily.   HTN. Holding losartan.  Nifedipine switched to Cardizem.   HLD. Continuing statin.   COPD. No evidence of exacerbation. Continue inhalers.  And nebulizer. Repeat chest x-ray for 5/18 shows no acute abnormality.   Obesity Body mass index is 30.99 kg/m.  Placing the pt at higher risk of poor outcomes.    Stage 1 pressure injury left buttocks  POA Continue foam dressing.    Left extremity restriction. History of PAD Patient does have history of breast cancer and therefore left upper extremity is wrist should be restricted. But also has history of PAD and follows up with vascular surgery and required innominate artery bypass which unfortunately is clotted therefore right upper extremity is not providing accurate blood pressure. Vascular surgery has recommended to use left upper extremity wrist for blood pressure.  Family understand the risk of lymphedema.  DVT prophylaxis: apixaban (ELIQUIS) tablet 2.5 mg Start: 02/22/23 2200   Code Status: Full Code  Family Communication:  None present at bedside.  Plan of care discussed with patient in length and he/she verbalized understanding and agreed with it.  Status is: Inpatient Remains inpatient appropriate because: Pending outpatient HD arrangements.   Estimated body mass index is 29.32 kg/m as calculated from the following:   Height as of this encounter: 5\' 6"  (1.676 m).   Weight as of this encounter: 82.4 kg.  Pressure Injury 02/17/23 Buttocks Left Deep Tissue Pressure Injury - Purple or maroon localized area of discolored intact skin or blood-filled blister due to damage of underlying  soft tissue from pressure and/or shear. (Active)  02/17/23 0630  Location: Buttocks  Location Orientation: Left  Staging: Deep Tissue Pressure Injury - Purple or maroon localized area of discolored intact skin or blood-filled blister due to damage of underlying soft tissue from pressure and/or shear.  Wound Description (Comments):   Present on Admission:   Dressing Type Foam - Lift dressing to assess site every shift 02/22/23 2100   Nutritional Assessment: Body mass index is 29.32 kg/m.Marland Kitchen Seen by dietician.  I agree with the assessment and plan as outlined below: Nutrition Status:        . Skin Assessment: I have examined the patient's skin and I agree with the wound assessment as performed by the wound care RN as outlined below: Pressure Injury 02/17/23 Buttocks Left Deep Tissue Pressure Injury - Purple or maroon localized area of discolored intact skin or blood-filled blister due to damage of underlying soft tissue from pressure and/or shear. (Active)  02/17/23 0630  Location: Buttocks  Location Orientation: Left  Staging: Deep Tissue Pressure Injury - Purple or maroon localized area of discolored intact skin or blood-filled blister due to damage of underlying soft tissue from pressure and/or shear.  Wound Description (Comments):   Present on Admission:   Dressing Type Foam - Lift dressing to assess site every shift 02/22/23 2100    Consultants:  Nephrology and orthopedics  Procedures:  As above  Antimicrobials:  Anti-infectives (From admission, onward)    Start     Dose/Rate Route Frequency Ordered Stop   02/21/23 1530  vancomycin (VANCOCIN) IVPB 1000 mg/200 mL premix        1,000 mg 200 mL/hr over 60 Minutes Intravenous To Radiology 02/20/23 1253 02/22/23 1530   02/17/23 2200  ceFAZolin (ANCEF) IVPB 1 g/50 mL premix        1 g 100 mL/hr over 30 Minutes Intravenous  Once 02/17/23 1439 02/17/23 2357   02/17/23 0730  ceFAZolin (ANCEF) IVPB 2g/100 mL premix        2  g 200 mL/hr over 30 Minutes Intravenous On call to O.R. 02/17/23 0640 02/17/23 1055         Subjective: Patient seen and examined.  Complains of severe pain in the right lower extremity.  No other complaint.  Objective: Vitals:   02/22/23 1922 02/22/23 2357 02/23/23 0354 02/23/23 0500  BP: 134/65 (!) 142/70 (!) 138/58   Pulse: 88 (!) 109 61 88  Resp: 14 17 18 16   Temp: 97.7 F (36.5 C) 98 F (36.7 C) 97.6 F (36.4 C)   TempSrc: Oral Oral Oral   SpO2: 96% 98% 98% 96%  Weight:    82.4 kg  Height:        Intake/Output Summary (Last 24 hours) at 02/23/2023 0912 Last data filed at 02/23/2023 1610 Gross per 24 hour  Intake 120 ml  Output --  Net 120 ml   Filed Weights   02/21/23 0603 02/22/23 0500 02/23/23 0500  Weight: 87.1 kg 82.8 kg 82.4 kg    Examination:  General exam: Appears in pain Respiratory system: Clear to auscultation. Respiratory effort normal. Cardiovascular system: S1 & S2 heard, irregularly irregular rate and rhythm. No JVD, murmurs, rubs, gallops or clicks. No pedal edema. Gastrointestinal system: Abdomen is nondistended, soft and nontender. No organomegaly or masses felt. Normal bowel sounds heard. Central nervous system: Alert and oriented. No focal neurological deficits. Extremities: Has dressing in the right lower extremity. Psychiatry: Judgement and insight appear normal. Mood & affect appropriate.    Data Reviewed: I have personally reviewed following labs and imaging studies  CBC: Recent Labs  Lab 02/19/23 0135 02/20/23 1002 02/21/23 0205 02/22/23 0137 02/23/23 0300  WBC 10.6* 12.0* 12.5* 10.0 9.1  NEUTROABS  --  9.1* 9.5*  --   --   HGB 9.4* 9.7* 9.7* 9.6* 10.0*  HCT 31.3* 33.2* 32.6* 31.9* 33.4*  MCV 102.6* 103.4* 101.6* 100.9* 100.0  PLT 183 225 216 211 231   Basic Metabolic Panel: Recent Labs  Lab 02/17/23 0210 02/18/23 0159 02/19/23 0135 02/20/23 1002 02/22/23 0137 02/23/23 0300  NA 132* 133* 133* 134* 133* 134*  K 5.3*  3.8 4.4 4.4 4.1 4.2  CL 94* 93* 95* 97* 97* 95*  CO2 24 28 24 23 25 24   GLUCOSE 155* 130* 119* 111* 121* 98  BUN 48* 24* 42* 51* 41* 59*  CREATININE 5.07* 3.54* 5.24* 6.00* 4.67* 5.50*  CALCIUM 8.7* 8.2* 8.5* 8.7* 8.5* 9.2  MG 1.9 1.8 1.9  --   --   --   PHOS  --  5.3*  --   --  5.3* 6.2*   GFR: Estimated Creatinine Clearance: 9 mL/min (A) (by C-G formula based on SCr of 5.5 mg/dL (H)). Liver Function Tests: Recent Labs  Lab 02/20/23 1002 02/22/23 0137 02/23/23 0300  AST 28  --   --   ALT 5  --   --   ALKPHOS 60  --   --   BILITOT 0.5  --   --   PROT 6.0*  --   --  ALBUMIN 2.9* 2.4* 2.4*   No results for input(s): "LIPASE", "AMYLASE" in the last 168 hours. Recent Labs  Lab 02/20/23 1002  AMMONIA <10   Coagulation Profile: Recent Labs  Lab 02/21/23 0205  INR 1.1   Cardiac Enzymes: No results for input(s): "CKTOTAL", "CKMB", "CKMBINDEX", "TROPONINI" in the last 168 hours. BNP (last 3 results) No results for input(s): "PROBNP" in the last 8760 hours. HbA1C: No results for input(s): "HGBA1C" in the last 72 hours. CBG: Recent Labs  Lab 02/22/23 0548 02/22/23 1137 02/22/23 1705 02/22/23 2119 02/23/23 0610  GLUCAP 96 104* 119* 112* 73   Lipid Profile: No results for input(s): "CHOL", "HDL", "LDLCALC", "TRIG", "CHOLHDL", "LDLDIRECT" in the last 72 hours. Thyroid Function Tests: Recent Labs    02/20/23 1002  TSH 2.854  FREET4 0.84   Anemia Panel: Recent Labs    02/20/23 1002  VITAMINB12 2,346*   Sepsis Labs: No results for input(s): "PROCALCITON", "LATICACIDVEN" in the last 168 hours.  Recent Results (from the past 240 hour(s))  Surgical PCR screen     Status: None   Collection Time: 02/16/23  4:06 PM   Specimen: Nasal Mucosa; Nasal Swab  Result Value Ref Range Status   MRSA, PCR NEGATIVE NEGATIVE Final   Staphylococcus aureus NEGATIVE NEGATIVE Final    Comment: (NOTE) The Xpert SA Assay (FDA approved for NASAL specimens in patients 22 years of  age and older), is one component of a comprehensive surveillance program. It is not intended to diagnose infection nor to guide or monitor treatment. Performed at [facility], [address] [place], [place] [address]      Radiology Studies: IR Fluoro Guide CV Line Left  Result Date: 02/21/2023 INDICATION: 82 year old with end-stage renal disease and poorly functioning left jugular tunneled dialysis catheter. EXAM: EXCHANGE OF TUNNELED DIALYSIS CATHETER WITH FLUOROSCOPY Physician: Rachelle Hora. Henn, MD MEDICATIONS: 1% lidocaine for local anesthetic ANESTHESIA/SEDATION: None FLUOROSCOPY: Radiation Exposure Index (as provided by the fluoroscopic device): 7 mGy Kerma COMPLICATIONS: None immediate. PROCEDURE: Informed consent was obtained for exchange of a tunneled dialysis catheter. Left chest and existing catheter were prepped and draped in sterile fashion. Maximal barrier sterile technique was utilized including caps, mask, sterile gowns, sterile gloves, sterile drape, hand hygiene and skin antiseptic. Heparin was removed from both lumens. The skin was anesthetized using 1% lidocaine. The catheter cuff was exposed using blunt dissection. Catheter was removed over a stiff Glidewire. A new 23 cm tip to cuff Palindrome catheter was advanced over the wire. The tip was placed in the right atrium. Both lumens aspirated and flushed well. Appropriate amount of heparin was placed in both lumens. Catheter was sutured to skin and a dressing was placed. Fluoroscopic images were taken and saved for this procedure. FINDINGS: New catheter tip in the right atrium. IMPRESSION: Successful exchange of the tunneled dialysis catheter with fluoroscopy. Electronically Signed   By: Richarda Overlie M.D.   On: 02/21/2023 15:10    Scheduled Meds:  acetaminophen  500 mg Oral QID   apixaban  2.5 mg Oral BID   Chlorhexidine Gluconate Cloth  6 each Topical Q0600   citalopram  40 mg Oral Daily   darbepoetin (ARANESP) injection -  DIALYSIS  25 mcg Subcutaneous Q Tue-1800   diltiazem  120 mg Oral Daily   feeding supplement (NEPRO CARB STEADY)  237 mL Oral TID BM   fluticasone furoate-vilanterol  1 puff Inhalation Daily   hydrocortisone  10 mg Oral Daily   hydrocortisone  5  mg Oral QPM   insulin aspart  0-5 Units Subcutaneous QHS   insulin aspart  0-9 Units Subcutaneous TID WC   levothyroxine  25 mcg Oral Q0600   melatonin  5 mg Oral QHS   metoprolol succinate  25 mg Oral QHS   multivitamin with minerals  1 tablet Oral QPM   paricalcitol  3.5 mcg Intravenous Once per day on Mon Wed Fri   senna-docusate  2 tablet Oral BID   sucralfate  1 g Oral TID WC & HS   umeclidinium bromide  1 puff Inhalation Daily   Continuous Infusions:   LOS: 8 days   Hughie Closs, MD [facility]  02/23/2023, 9:12 AM   *Please note that this is a verbal dictation therefore any spelling or grammatical errors are due to the "[employer]agon [employer] One" system interpretation.  Please page via Amion and do not message via secure chat for urgent patient care matters. Secure chat can be used for non urgent patient care matters.  How to contact the [facility] Attending or Consulting provider 7A - 7P or covering provider during after hours 7P -7A, for this patient?  Check the care team in [facility] and look for a) attending/consulting [facility] provider listed and b) the [facility] team listed. Page or secure chat 7A-7P. Log into [email or web address] and use 's universal password to access. If you do not have the password, please contact the hospital operator. Locate the [facility] provider you are looking for under [facility] and page to a number that you can be directly reached. If you still have difficulty reaching the provider, please page the [facility] (Director on Call) for the Hospitalists listed on amion for assistance.

## 2023-02-24 DIAGNOSIS — S82851A Displaced trimalleolar fracture of right lower leg, initial encounter for closed fracture: Secondary | ICD-10-CM | POA: Diagnosis not present

## 2023-02-24 LAB — RENAL FUNCTION PANEL
Albumin: 2.5 g/dL — ABNORMAL LOW (ref 3.5–5.0)
Anion gap: 10 (ref 5–15)
BUN: 70 mg/dL — ABNORMAL HIGH (ref 8–23)
CO2: 27 mmol/L (ref 22–32)
Calcium: 9.2 mg/dL (ref 8.9–10.3)
Chloride: 96 mmol/L — ABNORMAL LOW (ref 98–111)
Creatinine, Ser: 6.21 mg/dL — ABNORMAL HIGH (ref 0.44–1.00)
GFR, Estimated: 6 mL/min — ABNORMAL LOW (ref >60)
Glucose, Bld: 111 mg/dL — ABNORMAL HIGH (ref 70–99)
Phosphorus: 5.5 mg/dL — ABNORMAL HIGH (ref 2.5–4.6)
Potassium: 6.1 mmol/L — ABNORMAL HIGH (ref 3.5–5.1)
Sodium: 133 mmol/L — ABNORMAL LOW (ref 135–145)

## 2023-02-24 LAB — CBC
HCT: 31.1 % — ABNORMAL LOW (ref 36.0–46.0)
Hemoglobin: 9.5 g/dL — ABNORMAL LOW (ref 12.0–15.0)
MCH: 30.4 pg (ref 26.0–34.0)
MCHC: 30.5 g/dL (ref 30.0–36.0)
MCV: 99.4 fL (ref 80.0–100.0)
Platelets: 278 10*3/uL (ref 150–400)
RBC: 3.13 MIL/uL — ABNORMAL LOW (ref 3.87–5.11)
RDW: 16.2 % — ABNORMAL HIGH (ref 11.5–15.5)
WBC: 11.2 10*3/uL — ABNORMAL HIGH (ref 4.0–10.5)
nRBC: 0 % (ref 0.0–0.2)

## 2023-02-24 LAB — GLUCOSE, CAPILLARY
Glucose-Capillary: 62 mg/dL — ABNORMAL LOW (ref 70–99)
Glucose-Capillary: 127 mg/dL — ABNORMAL HIGH (ref 70–99)
Glucose-Capillary: 88 mg/dL (ref 70–99)

## 2023-02-24 MED ORDER — OXYCODONE HCL 5 MG PO TABS: 5.0000 mg | ORAL_TABLET | ORAL | 0 refills | Status: AC | PRN

## 2023-02-24 MED ORDER — ALBUMIN HUMAN 25 % IV SOLN
25.0000 g | INTRAVENOUS | Status: AC | PRN
  Administered 2023-02-24 (×2): 25 g via INTRAVENOUS
  Filled 2023-02-24 (×2): qty 100

## 2023-02-24 MED ORDER — ALTEPLASE 2 MG IJ SOLR: 2.0000 mg | Freq: Once | INTRAMUSCULAR | Status: DC | PRN

## 2023-02-24 MED ORDER — HEPARIN SODIUM (PORCINE) 1000 UNIT/ML DIALYSIS
5000.0000 [IU] | INTRAMUSCULAR | Status: DC | PRN
  Administered 2023-02-24: 5000 [IU] via INTRAVENOUS_CENTRAL

## 2023-02-24 MED ORDER — CYCLOBENZAPRINE HCL 5 MG PO TABS: 5.0000 mg | ORAL_TABLET | Freq: Three times a day (TID) | ORAL | 0 refills | Status: AC

## 2023-02-24 MED ORDER — APIXABAN 2.5 MG PO TABS: 2.5000 mg | ORAL_TABLET | Freq: Two times a day (BID) | ORAL | 0 refills | Status: AC

## 2023-02-24 MED ORDER — MIDODRINE HCL 5 MG PO TABS
10.0000 mg | ORAL_TABLET | Freq: Once | ORAL | Status: AC
  Administered 2023-02-24: 10 mg via ORAL

## 2023-02-24 MED ORDER — HEPARIN SODIUM (PORCINE) 1000 UNIT/ML IJ SOLN
INTRAMUSCULAR | Status: AC
  Administered 2023-02-24: 1000 [IU] via INTRAVENOUS_CENTRAL
  Filled 2023-02-24: qty 4

## 2023-02-24 MED ORDER — DILTIAZEM HCL ER COATED BEADS 120 MG PO CP24: 120.0000 mg | ORAL_CAPSULE | Freq: Every day | ORAL | 0 refills | Status: AC

## 2023-02-24 MED ORDER — HEPARIN SODIUM (PORCINE) 1000 UNIT/ML DIALYSIS
1000.0000 [IU] | INTRAMUSCULAR | Status: DC | PRN
  Administered 2023-02-24: 1000 [IU]
  Filled 2023-02-24: qty 1

## 2023-02-24 MED ORDER — ANTICOAGULANT SODIUM CITRATE 4% (200MG/5ML) IV SOLN: 5.0000 mL | Status: DC | PRN

## 2023-02-24 MED ORDER — MIDODRINE HCL 5 MG PO TABS
10.0000 mg | ORAL_TABLET | Freq: Three times a day (TID) | ORAL | Status: DC
  Filled 2023-02-24: qty 2

## 2023-02-24 NOTE — Progress Notes (Signed)
RN notified MD UF off given 200cc of NS d/t blood pressure

## 2023-02-24 NOTE — Plan of Care (Signed)

## 2023-02-24 NOTE — Progress Notes (Addendum)
Order to discharge pt to [facility] room 104.  Report called to Suarez at [place]. Discharge instructions/AVS placed in discharge packet.  Medication prescriptions placed in packet. Family updated.  Pt glasses, cell phone, and charger to transport with pt.

## 2023-02-24 NOTE — Discharge Summary (Signed)
Physician Discharge Summary  Addylyn Lutton ZOX:[id number] DOB: 06-09-1941 DOA: 02/15/2023  PCP: Pcp, No  Admit date: 02/15/2023 Discharge date: 02/24/2023 30 Day Unplanned Readmission Risk Score    Flowsheet Row ED to Hosp-Admission (Current) from 02/15/2023 in [facility] KIDNEY DIALYSIS UNIT  30 Day Unplanned Readmission Risk Score (%) 17.18 Filed at 02/24/2023 0801       This score is the patient's risk of an unplanned readmission within 30 days of being discharged (0 -100%). The score is based on dignosis, age, lab data, medications, orders, and past utilization.   Low:  0-14.9   Medium: 15-21.9   High: 22-29.9   Extreme: 30 and above          Admitted From: Home Disposition: SNF  Recommendations for Outpatient Follow-up:  Follow up with PCP in 1-2 weeks Please obtain BMP/CBC in one week Resume outpatient dialysis Monday Wednesday Friday schedule Please follow up with your PCP on the following pending results: Unresulted Labs (From admission, onward)     Start     Ordered   02/24/23 0655  Renal function panel  ONCE - STAT,   STAT       Question:  Specimen collection method  Answer:  Lab=Lab collect   02/24/23 0654   02/24/23 0654  CBC  ONCE - STAT,   STAT       Question:  Specimen collection method  Answer:  Lab=Lab collect   02/24/23 0654   02/22/23 0500  Renal function panel  Daily,   R     Question:  Specimen collection method  Answer:  Lab=Lab collect   02/21/23 0737              Home Health: None Equipment/Devices: None  Discharge Condition: Stable CODE STATUS: Full code Diet recommendation: Cardiac  Subjective: Seen and examined in the dialysis unit.  She feels better today.  Pain better controlled.  Discussed with the discharge plan with her and her granddaughter over the phone, they are in agreement.  Brief/Interim Summary: Macon Weltzin is a 82 y.o. Caucasian female with PMH of ESRD on HD TTS, HLD, breast cancer, paroxysmal A-fib, GI  bleed not on any anticoagulation previously, nonobstructive CAD, hypothyroidism, adrenal insufficiency, COPD presented to the hospital with complaints of mechanical fall. Found to have trimalleolar fracture.  Developed A-fib with RVR on 5/15 requiring Cardizem drip and subsequently transitioned to oral Cardizem, rates controlled.  Seen by PT OT, they recommend SNF.  Details below.   Trimalleolar right ankle fracture closed After mechanical fall. Orthopedic consulted. Underwent ORIF of the ankle fracture and manual application of stress ankle syndesmosis. Per orthopedic nonweightbearing right leg for 6 weeks, no ankle motion, splint left in place for 3 weeks. Orthopedic recommend chemical DVT prophylaxis. Currently on 2.5 mg Eliquis twice daily.  Pain is very well-controlled on Flexeril and oxycodone.  She will be discharged on both of them.   Postop delirium. Resolved.   Paroxysmal A-fib with RVR. Known history of paroxysmal A-fib with RVR preoperatively secondary to pain. Required Cardizem drip. Now on oral Cardizem. Discontinued nifedipine. Continue metoprolol. Limited echocardiogram reassuring with normal EF Not on anticoagulation due to prior history of GI bleed. Has now been started on Eliquis 2.5 mg twice daily after discussion with the daughter by previous hospitalist.   History of GI bleed. Previous hospitalist had extensive discussion with the patient and granddaughter who is an Char[employer] fundraiser with regards to need for DVT prophylaxis.  Patient refusing heparin and Lovenox  injections. Currently granddaughter agreeable to initiate Eliquis knowing that that is a risk for bleeding.   ESRD HD TTS. Nephrology consulted. Continue HD per schedule.  HD catheter required exchange by IR.  However now that patient is going to go to significant [place] and her dialysis center will be changed transiently, she will be receiving hemodialysis on MWF schedule with the first dialysis tomorrow.  Currently she is  in dialysis.  Patient's blood pressure was running low this morning but she was asymptomatic.  Currently systolic is around 100.  Discussed with nephrologist Dr. Allena Katz, he thinks patient is stable for discharge.   Adrenal insufficiency. Patient is on Cortef 5 mg twice daily.  Did not receive any stress dose pre or intraoperatively. Postop stress dose was given.   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 Acute.  Blood loss anemia Baseline hemoglobin around 12.  Dropped postoperatively but remaining stable. No active bleeding seen after initiation of anticoagulation.  Will monitor for now.   Preoperative cardiac evaluation Tolerated surgery without any cardiac issues.   Nonobstructive CAD. Stress test in 2019 was negative for any acute abnormality. Echocardiogram recently showed EF of 60-65%. Repeated this admission and EF remains same Echocardiogram showed no wall motion abnormality or significant valvular abnormality. Monitor for now.   Postop ileus.  Resolved. Constipation Developed abdominal pain on 5/16.  X-ray abdomen shows evidence of possible ileus as well as constipation. Currently initiated on bowel regimen.  Tolerating oral diet. Repeat x-ray abdomen from 5/18 shows improvement.   Hypothyroidism. Continue Synthroid.   Mood disorder. Continue Celexa. Dose was reduced to 20 mg per renal dosing guidelines.  Family reported that the patient has not tolerated Celexa dose reduction in the past and would like to continue 40 mg daily.   HTN. Slightly low blood pressure.  Discontinuing losartan and nifedipine.  She will be on Cardizem and Lopressor.   HLD. Continuing statin.   COPD. No evidence of exacerbation. Continue inhalers.  And nebulizer. Repeat chest x-ray for 5/18 shows no acute abnormality.   Obesity Body mass index is 30.99 kg/m.  Placing the pt at higher risk of poor outcomes.    Stage 1 pressure injury left buttocks   POA Continue foam dressing.    Left extremity restriction. History of PAD Patient does have history of breast cancer and therefore left upper extremity is wrist should be restricted. But also has history of PAD and follows up with vascular surgery and required innominate artery bypass which unfortunately is clotted therefore right upper extremity is not providing accurate blood pressure. Vascular surgery has recommended to use left upper extremity wrist for blood pressure.  Family understand the risk of lymphedema.  Discharge plan was discussed with patient and/or family member and they verbalized understanding and agreed with it.  Discharge Diagnoses:  Principal Problem:   Trimalleolar fracture of ankle, closed, right, initial encounter Active Problems:   Unable to ambulate   End-stage renal disease on hemodialysis (HCC)   Type 2 diabetes mellitus with chronic kidney disease, without long-term current use of insulin (HCC)   Hypothyroidism   Essential hypertension   COPD without exacerbation (HCC)   Dyslipidemia   Adrenal insufficiency (HCC)   ESRD on hemodialysis (HCC)    Discharge Instructions   Allergies as of 02/24/2023       Reactions   Amoxicillin-pot Clavulanate Rash   Codeine Nausea And Vomiting   Venlafaxine Other (See Comments)   Hallucination  Medication List     STOP taking these medications    aspirin 325 MG tablet   ibuprofen 200 MG tablet Commonly known as: ADVIL   losartan 25 MG tablet Commonly known as: COZAAR   NIFEdipine 60 MG 24 hr tablet Commonly known as: PROCARDIA XL/NIFEDICAL XL       TAKE these medications    apixaban 2.5 MG Tabs tablet Commonly known as: ELIQUIS Take 1 tablet (2.5 mg total) by mouth 2 (two) times daily.   Biotin 300 MCG Tabs Take 300 mcg by mouth at bedtime.   citalopram 40 MG tablet Commonly known as: CELEXA Take 40 mg by mouth at bedtime.   cyclobenzaprine 5 MG tablet Commonly known as:  FLEXERIL Take 1 tablet (5 mg total) by mouth 3 (three) times daily.   diltiazem 120 MG 24 hr capsule Commonly known as: CARDIZEM CD Take 1 capsule (120 mg total) by mouth daily.   DSS 100 MG Caps Take 100 mg by mouth at bedtime.   ezetimibe 10 MG tablet Commonly known as: ZETIA Take 10 mg by mouth at bedtime.   fluticasone furoate-vilanterol 100-25 MCG/INH Aepb Commonly known as: BREO ELLIPTA Inhale 1 puff into the lungs daily.   hydrocortisone 10 MG tablet Commonly known as: CORTEF Take 5 mg by mouth 2 (two) times daily.   levothyroxine 25 MCG tablet Commonly known as: SYNTHROID Take 25 mcg by mouth daily before breakfast.   metoprolol succinate 25 MG 24 hr tablet Commonly known as: TOPROL-XL Take 25 mg by mouth at bedtime.   Multi-Vitamins Tabs Take 1 tablet by mouth every evening.   omeprazole 40 MG capsule Commonly known as: PRILOSEC Take 40 mg by mouth every evening.   oxyCODONE 5 MG immediate release tablet Commonly known as: Oxy IR/ROXICODONE Take 1 tablet (5 mg total) by mouth every 4 (four) hours as needed for moderate pain.   Proventil HFA 108 (90 Base) MCG/ACT inhaler Generic drug: albuterol Inhale 2 puffs into the lungs every 6 (six) hours as needed for wheezing.   albuterol (2.5 MG/3ML) 0.083% nebulizer solution Commonly known as: PROVENTIL Take 2.5 mg by nebulization every 6 (six) hours as needed for wheezing.   simvastatin 40 MG tablet Commonly known as: ZOCOR Take 40 mg by mouth every evening.   Spiriva HandiHaler 18 MCG inhalation capsule Generic drug: tiotropium Place 18 mcg into inhaler and inhale daily.        Follow-up Information     PCP Follow up in 1 week(s).          [facility]. Go on 02/25/2023.   Why: Schedule is Monday/Wednesday/Friday. Arrive at 11:30 for 11:45 chair time.  [address] [place], [place] [address] [phone]               Allergies  Allergen Reactions   Amoxicillin-Pot  Clavulanate Rash   Codeine Nausea And Vomiting   Venlafaxine Other (See Comments)    Hallucination       Consultations: Ortho and nephrology   Procedures/Studies: IR Fluoro Guide CV Line Left  Result Date: 02/21/2023 INDICATION: 82 year old with end-stage renal disease and poorly functioning left jugular tunneled dialysis catheter. EXAM: EXCHANGE OF TUNNELED DIALYSIS CATHETER WITH FLUOROSCOPY Physician: Rachelle Hora. Henn, MD MEDICATIONS: 1% lidocaine for local anesthetic ANESTHESIA/SEDATION: None FLUOROSCOPY: Radiation Exposure Index (as provided by the fluoroscopic device): 7 mGy Kerma COMPLICATIONS: None immediate. PROCEDURE: Informed consent was obtained for exchange of a tunneled dialysis catheter. Left chest and existing catheter were prepped and  draped in sterile fashion. Maximal barrier sterile technique was utilized including caps, mask, sterile gowns, sterile gloves, sterile drape, hand hygiene and skin antiseptic. Heparin was removed from both lumens. The skin was anesthetized using 1% lidocaine. The catheter cuff was exposed using blunt dissection. Catheter was removed over a stiff Glidewire. A new 23 cm tip to cuff Palindrome catheter was advanced over the wire. The tip was placed in the right atrium. Both lumens aspirated and flushed well. Appropriate amount of heparin was placed in both lumens. Catheter was sutured to skin and a dressing was placed. Fluoroscopic images were taken and saved for this procedure. FINDINGS: New catheter tip in the right atrium. IMPRESSION: Successful exchange of the tunneled dialysis catheter with fluoroscopy. Electronically Signed   By: Richarda Overlie M.D.   On: 02/21/2023 15:10   DG CHEST PORT 1 VIEW  Result Date: 02/19/2023 CLINICAL DATA:  Shortness of breath. EXAM: PORTABLE CHEST 1 VIEW COMPARISON:  One-view chest x-ray 02/15/2023 FINDINGS: Left IJ dialysis catheter is stable. Heart size upper limits of normal. Lung volumes have decreased some. No focal  airspace disease is present. Visualized soft tissues and bony thorax. IMPRESSION: 1. Low lung volumes. 2. No acute cardiopulmonary disease. Electronically Signed   By: Marin Roberts M.D.   On: 02/19/2023 09:40   DG Abd Portable 1V  Result Date: 02/19/2023 CLINICAL DATA:  Shortness of breath.  Ileus. EXAM: PORTABLE ABDOMEN - 1 VIEW COMPARISON:  02/17/2023 FINDINGS: Contrast is identified within the ascending and transverse colon. There has been improvement in dilatation of small bowel loops in the LEFT UPPER QUADRANT. A few LEFT mid abdominal bowel loops remain mildly enlarged, measuring up to 3.1 centimeters. There is no evidence for free intraperitoneal air. Visualized osseous structures have a normal appearance. IMPRESSION: 1. Interval improvement in dilatation of small bowel loops in the LEFT UPPER QUADRANT. 2. Contrast is identified within the ascending and transverse colon. Electronically Signed   By: Norva Pavlov M.D.   On: 02/19/2023 09:31   DG Abd Portable 1V  Addendum Date: 02/17/2023   ADDENDUM REPORT: 02/17/2023 23:08 ADDENDUM: These results were called by telephone at the time of interpretation on 02/17/2023 at 11:08 pm to provider Dr. Otis Dials, who verbally acknowledged these results. Electronically Signed   By: Darliss Cheney M.D.   On: 02/17/2023 23:08   Result Date: 02/17/2023 CLINICAL DATA:  Abdominal pain EXAM: PORTABLE ABDOMEN - 1 VIEW COMPARISON:  Abdominal x-ray 12/09/2022 FINDINGS: There are dilated air-filled small bowel loops measuring up to 4.2 cm in the left upper quadrant. Air seen throughout nondilated colon the level of the sigmoid. No suspicious calcifications. A single surgical clip is noted in the pelvis. There is a vascular stent in the left abdomen. Lung bases are clear. No acute fractures are identified. IMPRESSION: Dilated air-filled small bowel loops measuring up to 4.2 cm in the left upper quadrant. Findings are concerning for small bowel obstruction.  Electronically Signed: By: Darliss Cheney M.D. On: 02/17/2023 22:25   DG Ankle Complete Right  Result Date: 02/17/2023 CLINICAL DATA:  Right ankle fracture, postoperative examination EXAM: RIGHT ANKLE - COMPLETE 3+ VIEW COMPARISON:  None Available. FINDINGS: Three view radiograph of right ankle performed within an external immobilizer demonstrates surgical changes of bimalleolar ORIF with fracture fragments of the distal fibula and medial malleolus in near anatomic alignment. Posterior malleolar fracture fragment is not well visualized, however, the tibial plafond appears congruent. Normal overall alignment. No unexpected fracture or dislocation. IMPRESSION: 1. Status post  bimalleolar ORIF. Fracture fragments in near anatomic alignment. Electronically Signed   By: Helyn Numbers M.D.   On: 02/17/2023 21:37   DG Ankle Complete Right  Result Date: 02/17/2023 CLINICAL DATA:  [id number] Elective surgery [id number] EXAM: RIGHT ANKLE - COMPLETE 3+ VIEW COMPARISON:  Radiograph 02/15/2023 FINDINGS: Intraoperative images during medial and lateral malleolar ORIF. Improved fracture alignment. No evidence of immediate hardware complication. IMPRESSION: Intraoperative images during ankle fracture ORIF. No evidence of immediate hardware complication. Improved fracture alignment. Electronically Signed   By: Caprice Renshaw M.D.   On: 02/17/2023 12:25   DG C-Arm 1-60 Min-No Report  Result Date: 02/17/2023 Fluoroscopy was utilized by the requesting physician.  No radiographic interpretation.   DG C-Arm 1-60 Min-No Report  Result Date: 02/17/2023 Fluoroscopy was utilized by the requesting physician.  No radiographic interpretation.   ECHOCARDIOGRAM LIMITED  Result Date: 02/17/2023    ECHOCARDIOGRAM LIMITED REPORT   Patient Name:   DELANIA DIJOSEPH Date of Exam: 02/17/2023 Medical Rec #:  [id number]    Height:       66.0 in Accession #:    [id number]   Weight:       202.8 lb Date of Birth:  September 14, 1941    BSA:          2.012 m Patient  Age:    81 years     BP:           112/57 mmHg Patient Gender: F            HR:           0 bpm. Exam Location:  Inpatient Procedure: Limited Echo, Cardiac Doppler and Color Doppler Indications:    I48.91* Unspeicified atrial fibrillation  History:        Patient has no prior history of Echocardiogram examinations.                 COPD; Risk Factors:Hypertension, Diabetes and Dyslipidemia.                 ESRD.  Sonographer:    Sheralyn Boatman RDCS Referring Phys: [id number] [facility] M PATEL  Sonographer Comments: Technically difficult study due to poor echo windows. Patient screaming with leg pain. Pre-op study. Patient with broken leg IMPRESSIONS  1. Left ventricular ejection fraction, by estimation, is 60 to 65%. The left ventricle has normal function. The left ventricle has no regional wall motion abnormalities. There is moderate concentric left ventricular hypertrophy. Left ventricular diastolic function could not be evaluated.  2. A small pericardial effusion is present. The pericardial effusion is circumferential and posterior to the left ventricle.  3. The mitral valve is degenerative. Moderate mitral annular calcification.  4. The aortic valve is calcified. Aortic valve sclerosis/calcification is present, without any evidence of aortic stenosis. Aortic valve mean gradient measures 7.0 mmHg. Aortic valve Vmax measures 1.78 m/s.  5. There is normal pulmonary artery systolic pressure. The estimated right ventricular systolic pressure is 29.4 mmHg.  6. The inferior vena cava is normal in size with greater than 50% respiratory variability, suggesting right atrial pressure of 3 mmHg. FINDINGS  Left Ventricle: Left ventricular ejection fraction, by estimation, is 60 to 65%. The left ventricle has normal function. The left ventricle has no regional wall motion abnormalities. There is moderate concentric left ventricular hypertrophy. Left ventricular diastolic function could not be evaluated. Left ventricular diastolic  function could not be evaluated due to atrial fibrillation. Right Ventricle: There is normal pulmonary artery systolic pressure. The tricuspid regurgitant  velocity is 2.57 m/s, and with an assumed right atrial pressure of 3 mmHg, the estimated right ventricular systolic pressure is 29.4 mmHg. Pericardium: A small pericardial effusion is present. The pericardial effusion is circumferential and posterior to the left ventricle. Mitral Valve: The mitral valve is degenerative in appearance. There is mild thickening of the mitral valve leaflet(s). There is mild calcification of the mitral valve leaflet(s). Moderate mitral annular calcification. MV peak gradient, 14.4 mmHg. The mean mitral valve gradient is 4.0 mmHg. Tricuspid Valve: Tricuspid valve regurgitation is mild. Aortic Valve: The aortic valve is calcified. Aortic valve sclerosis/calcification is present, without any evidence of aortic stenosis. Aortic valve mean gradient measures 7.0 mmHg. Aortic valve peak gradient measures 12.6 mmHg. Venous: The inferior vena cava is normal in size with greater than 50% respiratory variability, suggesting right atrial pressure of 3 mmHg. Additional Comments: Spectral Doppler performed. Color Doppler performed.  LEFT VENTRICLE PLAX 2D LVIDd:         3.50 cm LVIDs:         2.40 cm LV PW:         1.70 cm LV IVS:        1.10 cm LVOT diam:     2.10 cm LVOT Area:     3.46 cm  LV Volumes (MOD) LV vol d, MOD A2C: 46.2 ml LV vol d, MOD A4C: 56.1 ml LV vol s, MOD A2C: 14.8 ml LV vol s, MOD A4C: 19.1 ml LV SV MOD A2C:     31.4 ml LV SV MOD A4C:     56.1 ml LV SV MOD BP:      34.7 ml IVC IVC diam: 1.90 cm AORTIC VALVE AV Vmax:      177.50 cm/s AV Vmean:     123.000 cm/s AV VTI:       0.330 m AV Peak Grad: 12.6 mmHg AV Mean Grad: 7.0 mmHg  AORTA Ao Asc diam: 3.20 cm MITRAL VALVE            TRICUSPID VALVE MV Peak grad: 14.4 mmHg TR Peak grad:   26.4 mmHg MV Mean grad: 4.0 mmHg  TR Vmax:        257.00 cm/s MV Vmax:      1.90 m/s MV Vmean:      93.7 cm/s SHUNTS                         Systemic Diam: 2.10 cm Armanda Magic MD Electronically signed by Armanda Magic MD Signature Date/Time: 02/17/2023/8:27:42 AM    Final    DG Pelvis 1-2 Views  Result Date: 02/15/2023 CLINICAL DATA:  Slipped and fell at the dialysis center.  Pain. EXAM: PELVIS - 1-2 VIEW COMPARISON:  None Available. FINDINGS: No fracture or bone lesion. Hip joints, SI joints and pubic symphysis are normally spaced and aligned. Soft tissues are unremarkable. IMPRESSION: Negative. Electronically Signed   By: Amie Portland M.D.   On: 02/15/2023 17:32   DG Ankle Complete Right  Result Date: 02/15/2023 CLINICAL DATA:  Larey Seat at the dialysis center.  Right ankle pain. EXAM: RIGHT ANKLE - COMPLETE 3+ VIEW COMPARISON:  None Available. FINDINGS: Oblique fracture of the distal fibula extending from the posterior metadiaphysis to the anteromedial distal metaphysis. Posterior fracture component is mildly displaced, 3 mm, posteriorly, and foreshortened by a similar degree. No fracture comminution. There is a nondisplaced fracture across the base of the medial malleolus best appreciated on the lateral view. No fracture  comminution. There is also a fracture of the posterior malleolus, superimposed on the distal fibular fracture on the lateral view, without significant displacement and without comminution. Ankle joint is normally spaced and aligned. There is surrounding soft tissue swelling. IMPRESSION: 1. Trimalleolar fracture as detailed above. Ankle joint normally aligned. Electronically Signed   By: Amie Portland M.D.   On: 02/15/2023 17:31   DG Humerus Left  Result Date: 02/15/2023 CLINICAL DATA:  Larey Seat at the dialysis center.  Left arm pain. EXAM: LEFT HUMERUS - 2+ VIEW COMPARISON:  None Available. FINDINGS: There is no evidence of fracture or other focal bone lesions. Soft tissues are unremarkable. IMPRESSION: Negative. Electronically Signed   By: Amie Portland M.D.   On: 02/15/2023 17:28   DG  Wrist Complete Right  Result Date: 02/15/2023 CLINICAL DATA:  Larey Seat at the dialysis center.  Right wrist pain. EXAM: RIGHT WRIST - COMPLETE 3+ VIEW COMPARISON:  None Available. FINDINGS: No fracture.  No bone lesion. Joints are normally spaced and aligned. There is significant dorsal soft tissue swelling. IMPRESSION: No fracture or dislocation. Electronically Signed   By: Amie Portland M.D.   On: 02/15/2023 17:28   DG Chest 2 View  Result Date: 02/15/2023 CLINICAL DATA:  Larey Seat at the dialysis center. EXAM: CHEST - 2 VIEW COMPARISON:  12/30/2022. FINDINGS: Cardiac silhouette is normal in size. No mediastinal or hilar masses or evidence of adenopathy. Small left pleural effusion. Linear opacity in the left mid lung consistent with atelectasis. Remainder of the lungs is clear. No right pleural effusion. No pneumothorax. Total left internal jugular central venous catheter is stable. Skeletal structures are demineralized, but intact. IMPRESSION: 1. No acute cardiopulmonary disease. 2. Small left pleural effusion. Electronically Signed   By: Amie Portland M.D.   On: 02/15/2023 17:26     Discharge Exam: Vitals:   02/24/23 1138 02/24/23 1212  BP: 122/70 (!) 112/91  Pulse: 99 97  Resp: (!) 21 18  Temp: 97.7 F (36.5 C) (!) 97.4 F (36.3 C)  SpO2: 94% 100%   Vitals:   02/24/23 1130 02/24/23 1131 02/24/23 1138 02/24/23 1212  BP: (!) 133/97 126/79 122/70 (!) 112/91  Pulse: 95 94 99 97  Resp: 19 15 (!) 21 18  Temp:   97.7 F (36.5 C) (!) 97.4 F (36.3 C)  TempSrc:   Oral Oral  SpO2: 95% 95% 94% 100%  Weight:   83 kg   Height:        General: Pt is alert, awake, not in acute distress Cardiovascular: RRR, S1/S2 +, no rubs, no gallops Respiratory: CTA bilaterally, no wheezing, no rhonchi Abdominal: Soft, NT, ND, bowel sounds + Extremities: Dressing in right lower extremity.    The results of significant diagnostics from this hospitalization (including imaging, microbiology, ancillary and  laboratory) are listed below for reference.     Microbiology: Recent Results (from the past 240 hour(s))  Surgical PCR screen     Status: None   Collection Time: 02/16/23  4:06 PM   Specimen: Nasal Mucosa; Nasal Swab  Result Value Ref Range Status   MRSA, PCR NEGATIVE NEGATIVE Final   Staphylococcus aureus NEGATIVE NEGATIVE Final    Comment: (NOTE) The Xpert SA Assay (FDA approved for NASAL specimens in patients 31 years of age and older), is one component of a comprehensive surveillance program. It is not intended to diagnose infection nor to guide or monitor treatment. Performed at [facility], [address] [place], [place] [address]  Labs: BNP (last 3 results) No results for input(s): "BNP" in the last 8760 hours. Basic Metabolic Panel: Recent Labs  Lab 02/18/23 0159 02/19/23 0135 02/20/23 1002 02/22/23 0137 02/23/23 0300 02/24/23 0147  NA 133* 133* 134* 133* 134* 133*  K 3.8 4.4 4.4 4.1 4.2 6.1*  CL 93* 95* 97* 97* 95* 96*  CO2 28 24 23 25 24 27   GLUCOSE 130* 119* 111* 121* 98 111*  BUN 24* 42* 51* 41* 59* 70*  CREATININE 3.54* 5.24* 6.00* 4.67* 5.50* 6.21*  CALCIUM 8.2* 8.5* 8.7* 8.5* 9.2 9.2  MG 1.8 1.9  --   --   --   --   PHOS 5.3*  --   --  5.3* 6.2* 5.5*   Liver Function Tests: Recent Labs  Lab 02/20/23 1002 02/22/23 0137 02/23/23 0300 02/24/23 0147  AST 28  --   --   --   ALT 5  --   --   --   ALKPHOS 60  --   --   --   BILITOT 0.5  --   --   --   PROT 6.0*  --   --   --   ALBUMIN 2.9* 2.4* 2.4* 2.5*   No results for input(s): "LIPASE", "AMYLASE" in the last 168 hours. Recent Labs  Lab 02/20/23 1002  AMMONIA <10   CBC: Recent Labs  Lab 02/20/23 1002 02/21/23 0205 02/22/23 0137 02/23/23 0300 02/24/23 0147  WBC 12.0* 12.5* 10.0 9.1 11.2*  NEUTROABS 9.1* 9.5*  --   --   --   HGB 9.7* 9.7* 9.6* 10.0* 9.5*  HCT 33.2* 32.6* 31.9* 33.4* 31.1*  MCV 103.4* 101.6* 100.9* 100.0 99.4  PLT 225 216 211 231 278   Cardiac  Enzymes: No results for input(s): "CKTOTAL", "CKMB", "CKMBINDEX", "TROPONINI" in the last 168 hours. BNP: Invalid input(s): "POCBNP" CBG: Recent Labs  Lab 02/23/23 1123 02/23/23 1628 02/23/23 2118 02/24/23 0617 02/24/23 0707  GLUCAP 124* 108* 80 62* 127*   D-Dimer No results for input(s): "DDIMER" in the last 72 hours. Hgb A1c No results for input(s): "HGBA1C" in the last 72 hours. Lipid Profile No results for input(s): "CHOL", "HDL", "LDLCALC", "TRIG", "CHOLHDL", "LDLDIRECT" in the last 72 hours. Thyroid function studies No results for input(s): "TSH", "T4TOTAL", "T3FREE", "THYROIDAB" in the last 72 hours.  Invalid input(s): "FREET3" Anemia work up No results for input(s): "VITAMINB12", "FOLATE", "FERRITIN", "TIBC", "IRON", "RETICCTPCT" in the last 72 hours. Urinalysis    Component Value Date/Time   COLORURINE YELLOW 08/21/2018 1536   APPEARANCEUR CLEAR 08/21/2018 1536   LABSPEC >1.030 (H) 08/21/2018 1536   PHURINE 5.5 08/21/2018 1536   GLUCOSEU NEGATIVE 08/21/2018 1536   HGBUR NEGATIVE 08/21/2018 1536   BILIRUBINUR NEGATIVE 08/21/2018 1536   KETONESUR NEGATIVE 08/21/2018 1536   PROTEINUR NEGATIVE 08/21/2018 1536   NITRITE NEGATIVE 08/21/2018 1536   LEUKOCYTESUR NEGATIVE 08/21/2018 1536   Sepsis Labs Recent Labs  Lab 02/21/23 0205 02/22/23 0137 02/23/23 0300 02/24/23 0147  WBC 12.5* 10.0 9.1 11.2*   Microbiology Recent Results (from the past 240 hour(s))  Surgical PCR screen     Status: None   Collection Time: 02/16/23  4:06 PM   Specimen: Nasal Mucosa; Nasal Swab  Result Value Ref Range Status   MRSA, PCR NEGATIVE NEGATIVE Final   Staphylococcus aureus NEGATIVE NEGATIVE Final    Comment: (NOTE) The Xpert SA Assay (FDA approved for NASAL specimens in patients 80 years of age and older), is one component of a comprehensive surveillance  program. It is not intended to diagnose infection nor to guide or monitor treatment. Performed at [facility], [address], [place], [place] [address]      Time coordinating discharge: Over 30 minutes  SIGNED:   Hughie Closs, MD  [facility] 02/24/2023, 12:37 PM *Please note that this is a verbal dictation therefore any spelling or grammatical errors are due to the "Dragon [employer] One" system interpretation. If 7PM-7AM, please contact night-coverage [email or web address]

## 2023-02-24 NOTE — Progress Notes (Signed)
Pt blood pressure began to decrease RN notified.Blood pressure recycled will continue to monitor.

## 2023-02-24 NOTE — Progress Notes (Signed)
D/C order noted. Contacted Romeo Apple, HD clinic social worker, to make him aware of pt's d/c to snf today and that pt should start at [facility] tomorrow (MWF 11:30 arrival for 11:45 chair time). Ben to contact [facility] to make them aware of pt starting tomorrow. [facility] has access to [facility] epic for any clinicals that may be needed. Will add HD arrangements to AVS.   Olivia Canter Renal Navigator [phone]

## 2023-02-24 NOTE — Progress Notes (Signed)
POST HD TX NOTE  02/24/23 1138  Vitals  Temp 97.7 F (36.5 C)  Temp Source Oral  BP 122/70  MAP (mmHg) 85  BP Location Right Arm  BP Method Automatic  Patient Position (if appropriate) Lying  Pulse Rate 99  Pulse Rate Source Monitor  ECG Heart Rate (!) 101  Resp (!) 21  Oxygen Therapy  SpO2 94 %  O2 Device Nasal Cannula  O2 Flow Rate (L/min) 2 L/min  Pulse Oximetry Type Continuous  During Treatment Monitoring  Intra-Hemodialysis Comments (S)   (post HD tx VS check)  Post Treatment  Dialyzer Clearance Lightly streaked  Duration of HD Treatment -hour(s) 3.5 hour(s)  Hemodialysis Intake (mL) 400 mL ( NS boluses + Albumin)  Liters Processed 84  Fluid Removed (mL) -400 mL (positive )  Tolerated HD Treatment No (Comment)  Post-Hemodialysis Comments (S)  tx completed w/ bp issues throughout tx. pt almost immediately dropped bp into the 60's and 70's, MD was made aware and orders received for Albumin 25% 25G x2, it had no effect on pt's bp, MD was at bedside and made aware pt had NS bolus and Albumin w/ no effect. MD ordered Midodrine 10mg . once she got that he bp came up, but then dropped again. then came back up. once UF was turned off it was never turned back on. UF goal not met, pt was positive . VSS. Medication Admin: Heparin 5000 units bolus, Albumin 25% 25G IVBP x2, Midodrine 10mg  po, Heparin Dwells 3800 units  Hemodialysis Catheter Left Internal jugular Double lumen Permanent (Tunneled)  Placement Date/Time: 02/21/23 1211   Serial / Lot #: [id number]  Expiration Date: 11/07/26  Time Out: Correct site;Correct patient;Correct procedure  Maximum sterile barrier precautions: Hand hygiene;Cap;Mask;Sterile gown;Sterile gloves;Large sterile ...  Site Condition No complications (slight blood staingin around biopatch)  Blue Lumen Status Heparin locked;Dead end cap in place  Red Lumen Status Heparin locked;Dead end cap in place  Purple Lumen Status N/A   Catheter fill solution Heparin 1000 units/ml  Catheter fill volume (Arterial) 1.9 cc  Catheter fill volume (Venous) 1.9  Dressing Type Transparent  Dressing Status Antimicrobial disc in place;Dry;Intact  Drainage Description Sanguineous (slight blood around biopatch)  Dressing Change Due 02/28/23  Post treatment catheter status Capped and Clamped

## 2023-02-24 NOTE — Plan of Care (Signed)
Problem: Education: Goal: Knowledge of General Education information will improve Description: Including pain rating scale, medication(s)/side effects and non-pharmacologic comfort measures Outcome: Progressing   Problem: Health Behavior/Discharge Planning: Goal: Ability to manage health-related needs will improve Outcome: Progressing   Problem: Clinical Measurements: Goal: Ability to maintain clinical measurements within normal limits will improve Outcome: Progressing Goal: Will remain free from infection Outcome: Progressing Goal: Diagnostic test results will improve Outcome: Progressing Goal: Respiratory complications will improve Outcome: Progressing Goal: Cardiovascular complication will be avoided Outcome: Progressing   Problem: Activity: Goal: Risk for activity intolerance will decrease Outcome: Progressing   Problem: Nutrition: Goal: Adequate nutrition will be maintained Outcome: Progressing   Problem: Coping: Goal: Level of anxiety will decrease Outcome: Progressing   Problem: Elimination: Goal: Will not experience complications related to bowel motility Outcome: Progressing Goal: Will not experience complications related to urinary retention Outcome: Progressing   Problem: Pain Managment: Goal: General experience of comfort will improve Outcome: Progressing   Problem: Safety: Goal: Ability to remain free from injury will improve Outcome: Progressing   Problem: Skin Integrity: Goal: Risk for impaired skin integrity will decrease Outcome: Progressing   Problem: Education: Goal: Ability to describe self-care measures that may prevent or decrease complications (Diabetes Survival Skills Education) will improve Outcome: Progressing Goal: Individualized Educational Video(s) Outcome: Progressing   Problem: Coping: Goal: Ability to adjust to condition or change in health will improve Outcome: Progressing   Problem: Fluid Volume: Goal: Ability to  maintain a balanced intake and output will improve Outcome: Progressing   Problem: Health Behavior/Discharge Planning: Goal: Ability to identify and utilize available resources and services will improve Outcome: Progressing Goal: Ability to manage health-related needs will improve Outcome: Progressing   Problem: Metabolic: Goal: Ability to maintain appropriate glucose levels will improve Outcome: Progressing   Problem: Nutritional: Goal: Maintenance of adequate nutrition will improve Outcome: Progressing Goal: Progress toward achieving an optimal weight will improve Outcome: Progressing   Problem: Skin Integrity: Goal: Risk for impaired skin integrity will decrease Outcome: Progressing   Problem: Tissue Perfusion: Goal: Adequacy of tissue perfusion will improve Outcome: Progressing   Problem: Education: Goal: Knowledge of General Education information will improve Description: Including pain rating scale, medication(s)/side effects and non-pharmacologic comfort measures Outcome: Progressing   Problem: Health Behavior/Discharge Planning: Goal: Ability to manage health-related needs will improve Outcome: Progressing   Problem: Clinical Measurements: Goal: Ability to maintain clinical measurements within normal limits will improve Outcome: Progressing Goal: Will remain free from infection Outcome: Progressing Goal: Diagnostic test results will improve Outcome: Progressing Goal: Respiratory complications will improve Outcome: Progressing Goal: Cardiovascular complication will be avoided Outcome: Progressing   Problem: Activity: Goal: Risk for activity intolerance will decrease Outcome: Progressing   Problem: Nutrition: Goal: Adequate nutrition will be maintained Outcome: Progressing   Problem: Coping: Goal: Level of anxiety will decrease Outcome: Progressing   Problem: Elimination: Goal: Will not experience complications related to bowel motility Outcome:  Progressing Goal: Will not experience complications related to urinary retention Outcome: Progressing   Problem: Pain Managment: Goal: General experience of comfort will improve Outcome: Progressing   Problem: Safety: Goal: Ability to remain free from injury will improve Outcome: Progressing   Problem: Skin Integrity: Goal: Risk for impaired skin integrity will decrease Outcome: Progressing   Problem: Education: Goal: Ability to describe self-care measures that may prevent or decrease complications (Diabetes Survival Skills Education) will improve Outcome: Progressing Goal: Individualized Educational Video(s) Outcome: Progressing   Problem: Coping: Goal: Ability to adjust to condition or change   in health will improve Outcome: Progressing   Problem: Fluid Volume: Goal: Ability to maintain a balanced intake and output will improve Outcome: Progressing   Problem: Health Behavior/Discharge Planning: Goal: Ability to identify and utilize available resources and services will improve Outcome: Progressing Goal: Ability to manage health-related needs will improve Outcome: Progressing   Problem: Metabolic: Goal: Ability to maintain appropriate glucose levels will improve Outcome: Progressing   Problem: Nutritional: Goal: Maintenance of adequate nutrition will improve Outcome: Progressing Goal: Progress toward achieving an optimal weight will improve Outcome: Progressing   Problem: Skin Integrity: Goal: Risk for impaired skin integrity will decrease Outcome: Progressing   Problem: Tissue Perfusion: Goal: Adequacy of tissue perfusion will improve Outcome: Progressing   

## 2023-02-24 NOTE — TOC Transition Note (Signed)
Transition of Care [facility]) - CM/SW Discharge Note   Patient Details  Name: Charlott Pflanz MRN: [id number] Date of Birth: Jun 23, 1941  [facility] CM/SW Contact:  Eduard Roux, LCSW Phone Number: 02/24/2023, 1:50 PM   Clinical Narrative:     Patient will Discharge to: [facility] Discharge Date: 02/24/2023 Family Notified: daughter Transport By: Sharin Mons  Per MD patient is ready for discharge. RN, patient, and facility notified of discharge. Discharge Summary sent to facility. RN given number for report [phone] RM 104. Ambulance transport requested for patient.   Clinical Social Worker signing off.  Antony Blackbird, MSW, LCSW Clinical Social Worker    Final next level of care: Skilled Nursing Facility Barriers to Discharge: Barriers Resolved   Patient Goals and CMS Choice CMS Medicare.gov Compare Post Acute Care list provided to:: Patient Represenative (must comment) (daughter) Choice offered to / list presented to : Adult Children  Discharge Placement                Patient chooses bed at: [facility] Patient to be transferred to facility by: [facility] Name of family member notified: daughter Patient and family notified of of transfer: 02/24/23  Discharge Plan and Services Additional resources added to the After Visit Summary for   In-house Referral: Clinical Social Work                                   Social Determinants of Health (SDOH) Interventions SDOH Screenings   Tobacco Use: Low Risk  (02/22/2023)     Readmission Risk Interventions     No data to display

## 2023-02-24 NOTE — Progress Notes (Signed)
[facility] arrived for transport.  Daughter, Melody notified of discharge.

## 2023-02-24 NOTE — Procedures (Signed)
Patient seen on Hemodialysis. BP (!) 84/75 (BP Location: Right Arm)   Pulse 82   Temp 97.9 F (36.6 C) (Oral)   Resp 17   Ht 5\' 6"  (1.676 m)   Wt 82.4 kg   SpO2 97%   BMI 29.32 kg/m   QB 400, UF goal 1.7L Having (asymptomatic) hypotension on HD; UF off and given albumin/midodrine. Unfortunately, the only location for BP check is her right forearm.    Zetta Bills MD [facility]. Office # [phone] Pager # [phone] 9:02 AM

## 2024-02-02 DEATH — deceased
